# Patient Record
Sex: Female | Born: 1945 | Race: White | Hispanic: No | Smoking: Former smoker
Health system: Southern US, Community
[De-identification: ages and names within clinical notes are randomized; demographics above are authoritative.]

## PROBLEM LIST (undated history)

## (undated) DIAGNOSIS — R269 Unspecified abnormalities of gait and mobility: Principal | ICD-10-CM

## (undated) DIAGNOSIS — F482 Pseudobulbar affect: Secondary | ICD-10-CM

## (undated) DIAGNOSIS — E039 Hypothyroidism, unspecified: Secondary | ICD-10-CM

## (undated) DIAGNOSIS — M1990 Unspecified osteoarthritis, unspecified site: Secondary | ICD-10-CM

## (undated) DIAGNOSIS — G4730 Sleep apnea, unspecified: Secondary | ICD-10-CM

## (undated) DIAGNOSIS — G20 Parkinson's disease: Secondary | ICD-10-CM

## (undated) DIAGNOSIS — E669 Obesity, unspecified: Secondary | ICD-10-CM

## (undated) DIAGNOSIS — R5382 Chronic fatigue, unspecified: Secondary | ICD-10-CM

## (undated) DIAGNOSIS — M8580 Other specified disorders of bone density and structure, unspecified site: Secondary | ICD-10-CM

## (undated) DIAGNOSIS — F329 Major depressive disorder, single episode, unspecified: Secondary | ICD-10-CM

## (undated) DIAGNOSIS — F32A Depression, unspecified: Secondary | ICD-10-CM

## (undated) DIAGNOSIS — G231 Progressive supranuclear ophthalmoplegia [Steele-Richardson-Olszewski]: Secondary | ICD-10-CM

## (undated) DIAGNOSIS — G459 Transient cerebral ischemic attack, unspecified: Secondary | ICD-10-CM

## (undated) DIAGNOSIS — M25539 Pain in unspecified wrist: Secondary | ICD-10-CM

## (undated) DIAGNOSIS — H8109 Meniere's disease, unspecified ear: Secondary | ICD-10-CM

## (undated) DIAGNOSIS — E785 Hyperlipidemia, unspecified: Secondary | ICD-10-CM

## (undated) DIAGNOSIS — C50919 Malignant neoplasm of unspecified site of unspecified female breast: Secondary | ICD-10-CM

## (undated) DIAGNOSIS — F419 Anxiety disorder, unspecified: Secondary | ICD-10-CM

## (undated) HISTORY — DX: Unspecified abnormalities of gait and mobility: R26.9

## (undated) HISTORY — DX: Unspecified osteoarthritis, unspecified site: M19.90

## (undated) HISTORY — DX: Depression, unspecified: F32.A

## (undated) HISTORY — DX: Other specified disorders of bone density and structure, unspecified site: M85.80

## (undated) HISTORY — DX: Meniere's disease, unspecified ear: H81.09

## (undated) HISTORY — DX: Anxiety disorder, unspecified: F41.9

## (undated) HISTORY — PX: APPENDECTOMY: SHX54

## (undated) HISTORY — DX: Progressive supranuclear ophthalmoplegia (steele-Richardson-olszewski): G23.1

## (undated) HISTORY — DX: Obesity, unspecified: E66.9

## (undated) HISTORY — DX: Transient cerebral ischemic attack, unspecified: G45.9

## (undated) HISTORY — DX: Pain in unspecified wrist: M25.539

## (undated) HISTORY — DX: Sleep apnea, unspecified: G47.30

## (undated) HISTORY — DX: Parkinson's disease: G20

## (undated) HISTORY — DX: Chronic fatigue, unspecified: R53.82

## (undated) HISTORY — DX: Malignant neoplasm of unspecified site of unspecified female breast: C50.919

## (undated) HISTORY — DX: Pseudobulbar affect: F48.2

## (undated) HISTORY — DX: Hypothyroidism, unspecified: E03.9

## (undated) HISTORY — DX: Hyperlipidemia, unspecified: E78.5

## (undated) HISTORY — DX: Major depressive disorder, single episode, unspecified: F32.9

---

## 1982-11-27 HISTORY — PX: OOPHORECTOMY: SHX86

## 1982-11-27 HISTORY — PX: LAPAROSCOPIC VAGINAL HYSTERECTOMY: SUR798

## 1997-11-27 HISTORY — PX: NECK SURGERY: SHX720

## 2008-11-27 HISTORY — PX: MASTECTOMY: SHX3

## 2012-11-27 DIAGNOSIS — M25539 Pain in unspecified wrist: Secondary | ICD-10-CM

## 2012-11-27 HISTORY — DX: Pain in unspecified wrist: M25.539

## 2014-04-09 ENCOUNTER — Encounter: Payer: Self-pay | Admitting: Neurology

## 2014-04-13 ENCOUNTER — Ambulatory Visit (INDEPENDENT_AMBULATORY_CARE_PROVIDER_SITE_OTHER): Payer: Medicare Other | Admitting: Neurology

## 2014-04-13 ENCOUNTER — Encounter: Payer: Self-pay | Admitting: Neurology

## 2014-04-13 ENCOUNTER — Other Ambulatory Visit: Payer: Self-pay

## 2014-04-13 ENCOUNTER — Telehealth: Payer: Self-pay | Admitting: Neurology

## 2014-04-13 ENCOUNTER — Encounter (INDEPENDENT_AMBULATORY_CARE_PROVIDER_SITE_OTHER): Payer: Self-pay

## 2014-04-13 VITALS — BP 155/82 | HR 80 | Ht 70.0 in | Wt 274.0 lb

## 2014-04-13 DIAGNOSIS — R6889 Other general symptoms and signs: Secondary | ICD-10-CM

## 2014-04-13 DIAGNOSIS — D518 Other vitamin B12 deficiency anemias: Secondary | ICD-10-CM

## 2014-04-13 DIAGNOSIS — R269 Unspecified abnormalities of gait and mobility: Secondary | ICD-10-CM

## 2014-04-13 DIAGNOSIS — R413 Other amnesia: Secondary | ICD-10-CM

## 2014-04-13 HISTORY — DX: Unspecified abnormalities of gait and mobility: R26.9

## 2014-04-13 NOTE — Telephone Encounter (Signed)
MRI the brain was done on 10/27/2013. This shows mild atrophy and mild chronic small vessel ischemic changes. Nothing on the MRI explains her current symptomatology. This study was done at [facility].

## 2014-04-13 NOTE — Progress Notes (Signed)
Reason for visit: Gait disorder  Paige Murphy is a 68 y.o. female  History of present illness:  Paige Murphy is a 68 year old right-handed white female with a history of a gait disorder that began approximately 2 years ago. The patient has had increasing episodes of falling, particularly over the last several months. She indicates that she may fall backwards or forwards or to the side. The husband indicates that her recent episode seemed to be associated with an event of syncope. The patient denies any numbness or weakness of the extremities. She has had an alteration in speech with decreased amplitude, and her husband is having increasing problems understanding what she is saying. Her handwriting has changed, and has become more sloppy, and smaller in amplitude. The patient indicates that she has no problems controlling the bowels or the bladder. She does report a history of Mnire's disease, but her last event of vertigo was 3 years ago. She has been undergoing physical therapy for gait training actively. She has undergone CT scan evaluation of the brain on 2 occasions, and she has had a recent MRI of the brain. These studies were done at [facility], and the report is not available to me. She does have some fatigue during the day, and she has had some problems with swallowing. She also reports some progression of memory problems over the last 6 months. She is sent to this office for further evaluation. Her mother has a history of Parkinson's disease.  Past Medical History  Diagnosis Date  . Wrist pain 2014  . Abnormality of gait 04/13/2014  . Hypothyroid   . Dyslipidemia   . Obese   . Depression   . Osteopenia   . Breast cancer     s/p radiation  . Meniere's disease     Past Surgical History  Procedure Laterality Date  . Laparoscopic vaginal hysterectomy  1984  . Mastectomy  2010    breast cancer, left  . Neck surgery  1999    cervical procedure  . Oophorectomy  1984  .  Appendectomy      Family History  Problem Relation Age of Onset  . Dementia Mother   . COPD Mother   . Parkinsonism Mother   . Stroke Mother   . Heart disease Father   . Heart disease Brother   . Heart disease Sister     Social history:  reports that she has never smoked. She does not have any smokeless tobacco history on file. She reports that she drinks alcohol. She reports that she does not use illicit drugs.  Medications:  Current Outpatient Prescriptions on File Prior to Visit  Medication Sig Dispense Refill  . alendronate (FOSAMAX) 70 MG tablet Take 70 mg by mouth once a week. Take with a full glass of water on an empty stomach.      Marland Kitchen atorvastatin (LIPITOR) 40 MG tablet Take 40 mg by mouth daily.      Marland Kitchen escitalopram (LEXAPRO) 10 MG tablet Take 10 mg by mouth daily.      Marland Kitchen levothyroxine (SYNTHROID, LEVOTHROID) 125 MCG tablet Take 125 mcg by mouth daily before breakfast.      . Multiple Vitamin (MULTIVITAMIN) tablet Take 1 tablet by mouth daily.       No current facility-administered medications on file prior to visit.     No Known Allergies  ROS:  Out of a complete 14 system review of symptoms, the patient complains only of the following symptoms, and all  other reviewed systems are negative.  Weight gain, fatigue Hearing loss, ringing in the ears, dizziness, difficulty swallowing Moles Blurred vision Shortness of breath, snoring Urination problems Joint pain, joint swelling Memory loss, difficulty swallowing Depression, not enough sleep, decreased energy, and disinterest in activities Sleepiness, snoring  Blood pressure 155/82, pulse 80, height 5\' 10"  (1.778 m), weight 274 lb (124.286 kg).  Physical Exam  General: The patient is alert and cooperative at the time of the examination. Masking of the face is seen.  Eyes: Pupils are equal, round, and reactive to light. Discs are flat bilaterally.  Neck: The neck is supple, no carotid bruits are  noted.  Respiratory: The respiratory examination is clear.  Cardiovascular: The cardiovascular examination reveals a regular rate and rhythm, no obvious murmurs or rubs are noted.  Skin: Extremities are with 1+ edema of ankles bilaterally.  Neurologic Exam  Mental status: The patient is alert and oriented x 3 at the time of the examination. The patient has apparent normal recent and remote memory, with an apparently normal attention span and concentration ability.  Cranial nerves: Facial symmetry is present. There is good sensation of the face to pinprick and soft touch bilaterally. The strength of the facial muscles and the muscles to head turning and shoulder shrug are normal bilaterally. Speech is well enunciated, no aphasia or dysarthria is noted. Extraocular movements are full, with exception that there is slight restriction of superior gaze. Visual fields are full. The tongue is midline, and the patient has symmetric elevation of the soft palate. No obvious hearing deficits are noted.  Motor: The motor testing reveals 5 over 5 strength of all 4 extremities. Good symmetric motor tone is noted throughout.  Sensory: Sensory testing is intact to pinprick, soft touch, vibration sensation, and position sense on all 4 extremities. No evidence of extinction is noted.  Coordination: Cerebellar testing reveals good finger-nose-finger and heel-to-shin bilaterally.  Gait and station: Gait is associated with excellent arm swing, the patient has some difficulty arising from a seated position with arms crossed Tandem gait is slightly unsteady. Romberg is negative. No drift is seen.  Reflexes: Deep tendon reflexes are symmetric and normal bilaterally. Toes are downgoing bilaterally.   Assessment/Plan:  One. Gait disorder  2. Memory disorder  The patient apparently has already had MRI and CT evaluation of the brain. We will try to obtain the report, and the patient has the disc of the studies,  and she will mail this to our office. The patient will undergo blood work today. The patient has features of parkinsonism, but with walking, she has excellent arm swing. The patient needs to be evaluated for possible small vessel disease or normal pressure hydrocephalus as an etiology for her gait and memory problems. Progressive supranuclear palsy may begin with gait issues, and parkinsonism. She will followup in 4 months. The patient is to continue the physical therapy for gait training.   Jill Alexanders MD 04/13/2014 12:21 PM  [facility] [address] [place], [place] [address]  Phone [phone] Fax [phone]

## 2014-04-13 NOTE — Patient Instructions (Signed)

## 2014-04-15 LAB — TSH: TSH: 0.727 u[IU]/mL (ref 0.450–4.500)

## 2014-04-15 LAB — RPR: RPR: NONREACTIVE

## 2014-04-15 LAB — COPPER, SERUM: Copper: 104 ug/dL (ref 72–166)

## 2014-04-15 LAB — VITAMIN B12: VITAMIN B 12: 555 pg/mL (ref 211–946)

## 2014-05-05 ENCOUNTER — Telehealth: Payer: Self-pay | Admitting: *Deleted

## 2014-05-05 NOTE — Telephone Encounter (Signed)
I called the patient, left a message, I will call back later. 

## 2014-05-05 NOTE — Telephone Encounter (Signed)
Pt had normal labs, MRI atrophy and SVD, not explaining her sx.

## 2014-05-06 NOTE — Telephone Encounter (Signed)
I called patient. I discussed the results of the MRI and the blood work. The MRI showed minimal small vessel disease, blood work was unremarkable. The patient has some features of parkinsonism, but not a full exam consistent with Parkinson's disease. The patient will need to be followed for her walking problem over time. She has gone through some physical therapy with good benefit, no further falls. We will follow her over time to look for evolutionary changes that would suggest Parkinson's disease or progressive supranuclear palsy. We may consider medications for the patient in the future.

## 2014-08-14 ENCOUNTER — Encounter: Payer: Self-pay | Admitting: Neurology

## 2014-08-14 ENCOUNTER — Encounter (INDEPENDENT_AMBULATORY_CARE_PROVIDER_SITE_OTHER): Payer: Self-pay

## 2014-08-14 ENCOUNTER — Ambulatory Visit (INDEPENDENT_AMBULATORY_CARE_PROVIDER_SITE_OTHER): Payer: Medicare Other | Admitting: Neurology

## 2014-08-14 VITALS — BP 135/82 | HR 76 | Wt 266.0 lb

## 2014-08-14 DIAGNOSIS — R269 Unspecified abnormalities of gait and mobility: Secondary | ICD-10-CM

## 2014-08-14 MED ORDER — CARBIDOPA-LEVODOPA 25-100 MG PO TABS: ORAL_TABLET | ORAL | Status: DC

## 2014-08-14 NOTE — Patient Instructions (Signed)

## 2014-08-14 NOTE — Progress Notes (Signed)
Reason for visit: Gait disorder  Paige Murphy is an 68 y.o. female  History of present illness:  Paige Murphy is a 68 year old right-handed white female with a history of a progressive gait disorder. The patient continues to have difficulty with balance, and she has fallen on 10 occasions since last seen. The patient does not use a cane for ambulation. She indicates that she has gone through physical therapy, but this has not benefited her. The patient is having problems choking on her saliva, and she will drool on occasion. She continues to have difficulty getting up from a chair, and she feels as if the activities of daily living have slowed down. She has undergone MRI evaluation of the brain that shows minimal small vessel disease, blood work has been unremarkable. The patient returns for an evaluation. The patient has had some alteration in speech. She reports some neck discomfort, and some discomfort down the right arm that has occurred over the last several months.  Past Medical History  Diagnosis Date  . Wrist pain 2014  . Abnormality of gait 04/13/2014  . Hypothyroid   . Dyslipidemia   . Obese   . Depression   . Osteopenia   . Breast cancer     s/p radiation  . Meniere's disease     Past Surgical History  Procedure Laterality Date  . Laparoscopic vaginal hysterectomy  1984  . Mastectomy  2010    breast cancer, left  . Neck surgery  1999    cervical procedure  . Oophorectomy  1984  . Appendectomy      Family History  Problem Relation Age of Onset  . Dementia Mother   . COPD Mother   . Parkinsonism Mother   . Stroke Mother   . Heart disease Father   . Heart disease Brother   . Heart disease Sister     Social history:  reports that she has never smoked. She has never used smokeless tobacco. She reports that she drinks alcohol. She reports that she does not use illicit drugs.   No Known Allergies  Medications:  Current Outpatient Prescriptions on File Prior  to Visit  Medication Sig Dispense Refill  . alendronate (FOSAMAX) 70 MG tablet Take 70 mg by mouth once a week. Take with a full glass of water on an empty stomach.      Marland Kitchen aspirin 325 MG tablet Take 325 mg by mouth daily.      Marland Kitchen atorvastatin (LIPITOR) 40 MG tablet Take 40 mg by mouth daily.      Marland Kitchen levothyroxine (SYNTHROID, LEVOTHROID) 125 MCG tablet Take 125 mcg by mouth daily before breakfast.      . Multiple Vitamin (MULTIVITAMIN) tablet Take 1 tablet by mouth daily.       No current facility-administered medications on file prior to visit.    ROS:  Out of a complete 14 system review of symptoms, the patient complains only of the following symptoms, and all other reviewed systems are negative.  Fatigue Hearing loss, ringing in the ears, difficulty swallowing, drooling Cough, shortness of breath, choking Daytime sleepiness, snoring Joint pain, joint swelling, back pain, walking difficulties Daytime sleepiness, snoring Bruising easily Memory loss, dizziness, speech difficulty, weakness, facial drooping  Blood pressure 135/82, pulse 76, weight 266 lb (120.657 kg).  Blood pressure, standing, right arm is 164/88. Blood pressure, sitting, right arm is 162/80.  Physical Exam  General: The patient is alert and cooperative at the time of the examination. The patient  is moderately obese.  Skin: No significant peripheral edema is noted.   Neurologic Exam  Mental status: The patient is oriented x 3. Mini-Mental status examination done today shows a total score of 27/30.  Cranial nerves: Facial symmetry is present. Speech is mildly dysarthric. Extraocular movements are full. Visual fields are full. Masking of the face is seen.  Motor: The patient has good strength in all 4 extremities.  Sensory examination: Soft touch sensation is symmetric on the face, arms, and legs.  Coordination: The patient has good finger-nose-finger and heel-to-shin bilaterally.  Gait and station: The  patient has a relatively normal gait, good arm swing. The patient has some slight difficulty arising from a seated position with arms crossed. Tandem gait is slightly unsteady. Romberg is negative. No drift is seen.  Reflexes: Deep tendon reflexes are symmetric in the legs, but there is an increase in the left biceps and triceps reflex on the left relative to the right.   Assessment/Plan:  1. Gait disorder  The patient appears to have features of parkinsonism, but her ability to ambulate is not typical for Parkinson's disease or parkinsonism, as she has good arm swing, and relatively good posture. The patient will begin a trial on low-dose Sinemet taking the 25/100 mg tablets, one half times daily for 3 weeks, then go to 1 full tablet 3 times daily. The patient will followup in 3 months. The patient may have PSP or MSA rather than Parkinson's disease. The patient likely does have a neurodegenerative process of some sort resulting in her gait disorder. A second opinion in the future may be indicated.  Jill Alexanders MD 08/15/2014 9:35 AM  [facility] [address] [place], [place] [address]  Phone [phone] Fax [phone]

## 2014-10-14 ENCOUNTER — Encounter: Payer: Self-pay | Admitting: Neurology

## 2014-10-20 ENCOUNTER — Encounter: Payer: Self-pay | Admitting: Neurology

## 2014-10-21 ENCOUNTER — Encounter (HOSPITAL_COMMUNITY): Payer: Self-pay | Admitting: Emergency Medicine

## 2014-10-21 ENCOUNTER — Emergency Department (HOSPITAL_COMMUNITY): Payer: Medicare Other

## 2014-10-21 ENCOUNTER — Emergency Department (HOSPITAL_COMMUNITY)
Admission: EM | Admit: 2014-10-21 | Discharge: 2014-10-21 | Disposition: A | Discharge status: Home / Self Care | Payer: Medicare Other | Attending: Emergency Medicine | Admitting: Emergency Medicine

## 2014-10-21 DIAGNOSIS — Z853 Personal history of malignant neoplasm of breast: Secondary | ICD-10-CM | POA: Insufficient documentation

## 2014-10-21 DIAGNOSIS — E669 Obesity, unspecified: Secondary | ICD-10-CM | POA: Insufficient documentation

## 2014-10-21 DIAGNOSIS — Z79899 Other long term (current) drug therapy: Secondary | ICD-10-CM | POA: Insufficient documentation

## 2014-10-21 DIAGNOSIS — S60211A Contusion of right wrist, initial encounter: Secondary | ICD-10-CM

## 2014-10-21 DIAGNOSIS — S99911A Unspecified injury of right ankle, initial encounter: Secondary | ICD-10-CM | POA: Diagnosis present

## 2014-10-21 DIAGNOSIS — Y9389 Activity, other specified: Secondary | ICD-10-CM | POA: Insufficient documentation

## 2014-10-21 DIAGNOSIS — E785 Hyperlipidemia, unspecified: Secondary | ICD-10-CM | POA: Diagnosis not present

## 2014-10-21 DIAGNOSIS — Y998 Other external cause status: Secondary | ICD-10-CM | POA: Insufficient documentation

## 2014-10-21 DIAGNOSIS — W010XXA Fall on same level from slipping, tripping and stumbling without subsequent striking against object, initial encounter: Secondary | ICD-10-CM | POA: Diagnosis not present

## 2014-10-21 DIAGNOSIS — S93401A Sprain of unspecified ligament of right ankle, initial encounter: Secondary | ICD-10-CM | POA: Diagnosis not present

## 2014-10-21 DIAGNOSIS — Z8739 Personal history of other diseases of the musculoskeletal system and connective tissue: Secondary | ICD-10-CM | POA: Insufficient documentation

## 2014-10-21 DIAGNOSIS — F329 Major depressive disorder, single episode, unspecified: Secondary | ICD-10-CM | POA: Diagnosis not present

## 2014-10-21 DIAGNOSIS — R51 Headache: Secondary | ICD-10-CM | POA: Diagnosis not present

## 2014-10-21 DIAGNOSIS — R52 Pain, unspecified: Secondary | ICD-10-CM

## 2014-10-21 DIAGNOSIS — Y92009 Unspecified place in unspecified non-institutional (private) residence as the place of occurrence of the external cause: Secondary | ICD-10-CM | POA: Insufficient documentation

## 2014-10-21 DIAGNOSIS — W19XXXA Unspecified fall, initial encounter: Secondary | ICD-10-CM

## 2014-10-21 LAB — COMPREHENSIVE METABOLIC PANEL
ALK PHOS: 45 U/L (ref 39–117)
ALT: 21 U/L (ref 0–35)
AST: 20 U/L (ref 0–37)
Albumin: 3.9 g/dL (ref 3.5–5.2)
Anion gap: 11 (ref 5–15)
BUN: 16 mg/dL (ref 6–23)
CALCIUM: 9 mg/dL (ref 8.4–10.5)
CO2: 25 mEq/L (ref 19–32)
Chloride: 105 mEq/L (ref 96–112)
Creatinine, Ser: 0.77 mg/dL (ref 0.50–1.10)
GFR calc Af Amer: >90 mL/min (ref >90)
GFR calc non Af Amer: 86 mL/min — ABNORMAL LOW (ref >90)
Glucose, Bld: 127 mg/dL — ABNORMAL HIGH (ref 70–99)
POTASSIUM: 4.1 meq/L (ref 3.7–5.3)
SODIUM: 141 meq/L (ref 137–147)
TOTAL PROTEIN: 7.1 g/dL (ref 6.0–8.3)
Total Bilirubin: 0.3 mg/dL (ref 0.3–1.2)

## 2014-10-21 LAB — CBC WITH DIFFERENTIAL/PLATELET
BASOS PCT: 1 % (ref 0–1)
Basophils Absolute: 0.1 10*3/uL (ref 0.0–0.1)
EOS ABS: 0.2 10*3/uL (ref 0.0–0.7)
Eosinophils Relative: 2 % (ref 0–5)
HCT: 40.6 % (ref 36.0–46.0)
Hemoglobin: 13.7 g/dL (ref 12.0–15.0)
Lymphocytes Relative: 34 % (ref 12–46)
Lymphs Abs: 3.1 10*3/uL (ref 0.7–4.0)
MCH: 32 pg (ref 26.0–34.0)
MCHC: 33.7 g/dL (ref 30.0–36.0)
MCV: 94.9 fL (ref 78.0–100.0)
Monocytes Absolute: 0.7 10*3/uL (ref 0.1–1.0)
Monocytes Relative: 8 % (ref 3–12)
Neutro Abs: 5.1 10*3/uL (ref 1.7–7.7)
Neutrophils Relative %: 55 % (ref 43–77)
PLATELETS: 257 10*3/uL (ref 150–400)
RBC: 4.28 MIL/uL (ref 3.87–5.11)
RDW: 12.8 % (ref 11.5–15.5)
WBC: 9.2 10*3/uL (ref 4.0–10.5)

## 2014-10-21 MED ORDER — ACETAMINOPHEN 325 MG PO TABS
650.0000 mg | ORAL_TABLET | Freq: Once | ORAL | Status: AC
  Administered 2014-10-21: 650 mg via ORAL
  Filled 2014-10-21: qty 2

## 2014-10-21 NOTE — Discharge Instructions (Signed)
Fall Prevention and Home Safety Falls cause injuries and can affect all age groups. It is possible to use preventive measures to significantly decrease the likelihood of falls. There are many simple measures which can make your home safer and prevent falls. OUTDOORS  Repair cracks and edges of walkways and driveways.  Remove high doorway thresholds.  Trim shrubbery on the main path into your home.  Have good outside lighting.  Clear walkways of tools, rocks, debris, and clutter.  Check that handrails are not broken and are securely fastened. Both sides of steps should have handrails.  Have leaves, snow, and ice cleared regularly.  Use sand or salt on walkways during winter months.  In the garage, clean up grease or oil spills. BATHROOM  Install night lights.  Install grab bars by the toilet and in the tub and shower.  Use non-skid mats or decals in the tub or shower.  Place a plastic non-slip stool in the shower to sit on, if needed.  Keep floors dry and clean up all water on the floor immediately.  Remove soap buildup in the tub or shower on a regular basis.  Secure bath mats with non-slip, double-sided rug tape.  Remove throw rugs and tripping hazards from the floors. BEDROOMS  Install night lights.  Make sure a bedside light is easy to reach.  Do not use oversized bedding.  Keep a telephone by your bedside.  Have a firm chair with side arms to use for getting dressed.  Remove throw rugs and tripping hazards from the floor. KITCHEN  Keep handles on pots and pans turned toward the center of the stove. Use back burners when possible.  Clean up spills quickly and allow time for drying.  Avoid walking on wet floors.  Avoid hot utensils and knives.  Position shelves so they are not too high or low.  Place commonly used objects within easy reach.  If necessary, use a sturdy step stool with a grab bar when reaching.  Keep electrical cables out of the  way.  Do not use floor polish or wax that makes floors slippery. If you must use wax, use non-skid floor wax.  Remove throw rugs and tripping hazards from the floor. STAIRWAYS  Never leave objects on stairs.  Place handrails on both sides of stairways and use them. Fix any loose handrails. Make sure handrails on both sides of the stairways are as long as the stairs.  Check carpeting to make sure it is firmly attached along stairs. Make repairs to worn or loose carpet promptly.  Avoid placing throw rugs at the top or bottom of stairways, or properly secure the rug with carpet tape to prevent slippage. Get rid of throw rugs, if possible.  Have an electrician put in a light switch at the top and bottom of the stairs. OTHER FALL PREVENTION TIPS  Wear low-heel or rubber-soled shoes that are supportive and fit well. Wear closed toe shoes.  When using a stepladder, make sure it is fully opened and both spreaders are firmly locked. Do not climb a closed stepladder.  Add color or contrast paint or tape to grab bars and handrails in your home. Place contrasting color strips on first and last steps.  Learn and use mobility aids as needed. Install an electrical emergency response system.  Turn on lights to avoid dark areas. Replace light bulbs that burn out immediately. Get light switches that glow.  Arrange furniture to create clear pathways. Keep furniture in the same place.  Firmly attach carpet with non-skid or double-sided tape. °· Eliminate uneven floor surfaces. °· Select a carpet pattern that does not visually hide the edge of steps. °· Be aware of all pets. °OTHER HOME SAFETY TIPS °· Set the water temperature for 120° F (48.8° C). °· Keep emergency numbers on or near the telephone. °· Keep smoke detectors on every level of the home and near sleeping areas. °Document Released: 11/03/2002 Document Revised: 05/14/2012 Document Reviewed: 02/02/2012 °[employer]® Patient Information ©2015  [employer]. This information is not intended to replace advice given to you by your health care provider. Make sure you discuss any questions you have with your health care provider. °Contusion °A contusion is a deep bruise. Contusions are the result of an injury that caused bleeding under the skin. The contusion may turn blue, purple, or yellow. Minor injuries will give you a painless contusion, but more severe contusions may stay painful and swollen for a few weeks.  °CAUSES  °A contusion is usually caused by a blow, trauma, or direct force to an area of the body. °SYMPTOMS  °· Swelling and redness of the injured area. °· Bruising of the injured area. °· Tenderness and soreness of the injured area. °· Pain. °DIAGNOSIS  °The diagnosis can be made by taking a history and physical exam. An X-ray, CT scan, or MRI may be needed to determine if there were any associated injuries, such as fractures. °TREATMENT  °Specific treatment will depend on what area of the body was injured. In general, the best treatment for a contusion is resting, icing, elevating, and applying cold compresses to the injured area. Over-the-counter medicines may also be recommended for pain control. Ask your caregiver what the best treatment is for your contusion. °HOME CARE INSTRUCTIONS  °· Put ice on the injured area. °· Put ice in a plastic bag. °· Place a towel between your skin and the bag. °· Leave the ice on for 15-20 minutes, 3-4 times a day, or as directed by your health care provider. °· Only take over-the-counter or prescription medicines for pain, discomfort, or fever as directed by your caregiver. Your caregiver may recommend avoiding anti-inflammatory medicines (aspirin, ibuprofen, and naproxen) for 48 hours because these medicines may increase bruising. °· Rest the injured area. °· If possible, elevate the injured area to reduce swelling. °SEEK IMMEDIATE MEDICAL CARE IF:  °· You have increased bruising or swelling. °· You have  pain that is getting worse. °· Your swelling or pain is not relieved with medicines. °MAKE SURE YOU:  °· Understand these instructions. °· Will watch your condition. °· Will get help right away if you are not doing well or get worse. °Document Released: 08/23/2005 Document Revised: 11/18/2013 Document Reviewed: 09/18/2011 °Exit[employer]® Patient Information ©2015 [employer]. This information is not intended to replace advice given to you by your health care provider. Make sure you discuss any questions you have with your health care provider. ° °

## 2014-10-21 NOTE — ED Notes (Signed)
Pt. reports multiple falls for the last several weeks and again this evening at home , pt. lost her balance at fell on her right side at laundry room at home , no LOC ,alert and oriented , states pain at right ankle , right knee and right wrist.

## 2014-10-21 NOTE — ED Notes (Signed)
Family at bedside. 

## 2014-10-21 NOTE — ED Provider Notes (Signed)
CSN: [id number]     Arrival date & time 10/21/14  0035 History   This chart was scribed for Artis Delay, MD by Evelene Croon, ED Scribe. This patient was seen in room D35C/D35C and the patient's care was started 3:16 AM.  Chief Complaint  Patient presents with  . Fall    The history is provided by the patient and a relative. No language interpreter was used.   HPI Comments:  Paige Murphy is a 68 y.o. female who presents to the Emergency Department s/p fall last night complaining of 9/10 pain to her right ankle and knee following the incident.  She believes she lost her balance while in the laundry room but is unsure. She also notes she struck the back of her head but does not believe she lost consciousness. She rates her HA a 6-7/10. Per pt's family pt has experienced frequent falls within the last six months. She has followed up with PCP and referred to a neurologist. At this time she denies back pain. No alleviating factors noted.  Past Medical History  Diagnosis Date  . Wrist pain 2014  . Abnormality of gait 04/13/2014  . Hypothyroid   . Dyslipidemia   . Obese   . Depression   . Osteopenia   . Breast cancer     s/p radiation  . Meniere's disease    Past Surgical History  Procedure Laterality Date  . Laparoscopic vaginal hysterectomy  1984  . Mastectomy  2010    breast cancer, left  . Neck surgery  1999    cervical procedure  . Oophorectomy  1984  . Appendectomy     Family History  Problem Relation Age of Onset  . Dementia Mother   . COPD Mother   . Parkinsonism Mother   . Stroke Mother   . Heart disease Father   . Heart disease Brother   . Heart disease Sister    History  Substance Use Topics  . Smoking status: Never Smoker   . Smokeless tobacco: Never Used  . Alcohol Use: Yes   OB History    No data available     Review of Systems  Musculoskeletal: Positive for myalgias and arthralgias.  Neurological: Positive for headaches.  All other systems reviewed  and are negative.     Allergies  Review of patient's allergies indicates no known allergies.  Home Medications   Prior to Admission medications   Medication Sig Start Date End Date Taking? Authorizing Provider  alendronate (FOSAMAX) 70 MG tablet Take 70 mg by mouth once a week. Take with a full glass of water on an empty stomach.    Historical Provider, MD  aspirin 325 MG tablet Take 325 mg by mouth daily.    Historical Provider, MD  atorvastatin (LIPITOR) 40 MG tablet Take 40 mg by mouth daily.    Historical Provider, MD  carbidopa-levodopa (SINEMET) 25-100 MG per tablet One half tablet three times a day for 3 weeks, then take one tablet three times a day 08/14/14   Kathrynn Ducking, MD  citalopram (CELEXA) 20 MG tablet Take 20 mg by mouth daily.    Historical Provider, MD  levothyroxine (SYNTHROID, LEVOTHROID) 125 MCG tablet Take 125 mcg by mouth daily before breakfast.    Historical Provider, MD  Multiple Vitamin (MULTIVITAMIN) tablet Take 1 tablet by mouth daily.    Historical Provider, MD   BP 144/68 mmHg  Pulse 80  Temp(Src) 98.4 F (36.9 C) (Oral)  Resp 17  Ht  5\' 9"  (1.753 m)  Wt 260 lb (117.935 kg)  BMI 38.38 kg/m2  SpO2 98% Physical Exam  Constitutional: She is oriented to person, place, and time. She appears well-developed and well-nourished. No distress.  HENT:  Head: Normocephalic and atraumatic. Head is without raccoon's eyes and without Battle's sign.    Nose: Nose normal.  Eyes: Conjunctivae and EOM are normal. Pupils are equal, round, and reactive to light. No scleral icterus.  Neck: No spinous process tenderness and no muscular tenderness present.  Cardiovascular: Normal rate, regular rhythm, normal heart sounds and intact distal pulses.   No murmur heard. Pulmonary/Chest: Effort normal and breath sounds normal. She has no rales. She exhibits no tenderness.  Abdominal: Soft. There is no tenderness. There is no rebound and no guarding.  Musculoskeletal: Normal  range of motion. She exhibits no edema.       Right wrist: She exhibits tenderness (Mild ecchymosis). She exhibits normal range of motion.       Right knee: She exhibits normal range of motion, no swelling, no effusion and no deformity. Tenderness found.       Right ankle: She exhibits swelling (mild). She exhibits normal range of motion, no ecchymosis, no deformity, no laceration and normal pulse. Tenderness.       Thoracic back: She exhibits no tenderness and no bony tenderness.       Lumbar back: She exhibits no tenderness and no bony tenderness.  No evidence of trauma to extremities, except as noted.  2+ distal pulses.    Neurological: She is alert and oriented to person, place, and time.  Skin: Skin is warm and dry. No rash noted.  Psychiatric: She has a normal mood and affect.  Nursing note and vitals reviewed.   ED Course  Procedures   DIAGNOSTIC STUDIES:  Oxygen Saturation is 98% on RA, normal by my interpretation.    COORDINATION OF CARE:  3:22 AM Will order XRs. Discussed treatment plan with pt at bedside and pt agreed to plan.  Labs Review Labs Reviewed  COMPREHENSIVE METABOLIC PANEL - Abnormal; Notable for the following:    Glucose, Bld 127 (*)    GFR calc non Af Amer 86 (*)    All other components within normal limits  CBC WITH DIFFERENTIAL    Imaging Review Dg Wrist Complete Right  10/21/2014   CLINICAL DATA:  Fall.  Wrist pain  EXAM: RIGHT WRIST - COMPLETE 3+ VIEW  COMPARISON:  None.  FINDINGS: There is no evidence of fracture or dislocation. There is no evidence of arthropathy or other focal bone abnormality. Soft tissues are unremarkable.  IMPRESSION: Negative.   Electronically Signed   By: Kerby Moors M.D.   On: 10/21/2014 02:04   Dg Ankle Complete Right  10/21/2014   CLINICAL DATA:  Fall with right-sided ankle pain.  Initial encounter  EXAM: RIGHT ANKLE - COMPLETE 3+ VIEW  COMPARISON:  None.  FINDINGS: There is no evidence of fracture, dislocation, or  joint effusion. There is a 2 mm long metallic foreign body associated with the dermis of the plantar heel.  IMPRESSION: 1. No acute osseous findings. 2. 2 mm foreign body of the plantar heel dermis.   Electronically Signed   By: Jorje Guild M.D.   On: 10/21/2014 02:01   Ct Head Wo Contrast  10/21/2014   CLINICAL DATA:  Multiple falls over last few weeks. Fall today at home in laundry room. No loss of consciousness.  EXAM: CT HEAD WITHOUT CONTRAST  CT  CERVICAL SPINE WITHOUT CONTRAST  TECHNIQUE: Multidetector CT imaging of the head and cervical spine was performed following the standard protocol without intravenous contrast. Multiplanar CT image reconstructions of the cervical spine were also generated.  COMPARISON:  CT of the head October 02, 2014  FINDINGS: CT HEAD FINDINGS  The ventricles and sulci are normal for age. No intraparenchymal hemorrhage, mass effect nor midline shift. Patchy supratentorial white matter hypodensities are within normal range for patient's age and though non-specific suggest sequelae of chronic small vessel ischemic disease. No acute large vascular territory infarcts.  No abnormal extra-axial fluid collections. Basal cisterns are patent. Moderate calcific atherosclerosis of the carotid siphons.  No skull fracture. The included ocular globes and orbital contents are non-suspicious. The mastoid aircells and included paranasal sinuses are well-aerated.  CT CERVICAL SPINE FINDINGS  Cervical vertebral bodies and posterior elements appear intact on line of broad reversed cervical lordosis. Moderate C3-4, C4-5 and moderate to severe C6-7 degenerative discs. C1-2 articulation maintained with moderate to severe arthropathy. No destructive bony lesions. Nuchal ligament calcifications. Prevertebral soft tissues are nonsuspicious.  Uncovertebral hypertrophy and facet arthropathy without osseous canal stenosis. Severe LEFT C4-5, moderate to severe RIGHT C5-6 neural foraminal narrowing. Severe  lower cervical facet arthropathy on the RIGHT.  IMPRESSION: CT HEAD: No acute intracranial process ; normal noncontrast CT of the head for age, stable from prior imaging.  CT CERVICAL SPINE: Broad reversed cervical lordosis without acute fracture or malalignment.   Electronically Signed   By: Elon Alas   On: 10/21/2014 04:10   Ct Cervical Spine Wo Contrast  10/21/2014   CLINICAL DATA:  Multiple falls over last few weeks. Fall today at home in laundry room. No loss of consciousness.  EXAM: CT HEAD WITHOUT CONTRAST  CT CERVICAL SPINE WITHOUT CONTRAST  TECHNIQUE: Multidetector CT imaging of the head and cervical spine was performed following the standard protocol without intravenous contrast. Multiplanar CT image reconstructions of the cervical spine were also generated.  COMPARISON:  CT of the head October 02, 2014  FINDINGS: CT HEAD FINDINGS  The ventricles and sulci are normal for age. No intraparenchymal hemorrhage, mass effect nor midline shift. Patchy supratentorial white matter hypodensities are within normal range for patient's age and though non-specific suggest sequelae of chronic small vessel ischemic disease. No acute large vascular territory infarcts.  No abnormal extra-axial fluid collections. Basal cisterns are patent. Moderate calcific atherosclerosis of the carotid siphons.  No skull fracture. The included ocular globes and orbital contents are non-suspicious. The mastoid aircells and included paranasal sinuses are well-aerated.  CT CERVICAL SPINE FINDINGS  Cervical vertebral bodies and posterior elements appear intact on line of broad reversed cervical lordosis. Moderate C3-4, C4-5 and moderate to severe C6-7 degenerative discs. C1-2 articulation maintained with moderate to severe arthropathy. No destructive bony lesions. Nuchal ligament calcifications. Prevertebral soft tissues are nonsuspicious.  Uncovertebral hypertrophy and facet arthropathy without osseous canal stenosis. Severe LEFT  C4-5, moderate to severe RIGHT C5-6 neural foraminal narrowing. Severe lower cervical facet arthropathy on the RIGHT.  IMPRESSION: CT HEAD: No acute intracranial process ; normal noncontrast CT of the head for age, stable from prior imaging.  CT CERVICAL SPINE: Broad reversed cervical lordosis without acute fracture or malalignment.   Electronically Signed   By: Elon Alas   On: 10/21/2014 04:10   Dg Knee Complete 4 Views Right  10/21/2014   CLINICAL DATA:  Fall with right-sided knee pain.  Initial encounter  EXAM: RIGHT KNEE - COMPLETE 4+ VIEW  COMPARISON:  None.  FINDINGS: There is no evidence of fracture, dislocation, or joint effusion.  There is tricompartmental osteoarthritis with small marginal spurs and mild diffuse joint narrowing.  IMPRESSION: 1. No acute osseous findings. 2. Knee osteoarthritis.   Electronically Signed   By: Jorje Guild M.D.   On: 10/21/2014 02:03  All radiology studies independently viewed by me.      EKG Interpretation None      MDM   Final diagnoses:  Fall, initial encounter  Right ankle sprain, initial encounter  Wrist contusion, right, initial encounter    68 year old female with a six-month history of frequent falls, worked up by an outpatient neurologist and felt to be a movement disorder of one form or another, who presents tonight after fall.  Complains primarily of right ankle and right wrist pain. She also struck her head, but denies loss of consciousness. No neck pain.  Imaging negative. Placed in right ankle ace wrap.  Ambulated at baseline.  DC home with PCP and neurology followup.    I personally performed the services described in this documentation, which was scribed in my presence. The recorded information has been reviewed and is accurate.       Artis Delay, MD 10/21/14 [phone]

## 2014-12-10 ENCOUNTER — Ambulatory Visit: Payer: Medicare Other | Admitting: Neurology

## 2014-12-24 ENCOUNTER — Encounter: Payer: Self-pay | Admitting: Neurology

## 2014-12-24 ENCOUNTER — Ambulatory Visit (INDEPENDENT_AMBULATORY_CARE_PROVIDER_SITE_OTHER): Payer: Medicare Other | Admitting: Neurology

## 2014-12-24 VITALS — BP 126/77 | HR 91 | Ht 69.0 in | Wt 261.2 lb

## 2014-12-24 DIAGNOSIS — G20C Parkinsonism, unspecified: Secondary | ICD-10-CM

## 2014-12-24 DIAGNOSIS — G20 Parkinson's disease: Secondary | ICD-10-CM

## 2014-12-24 DIAGNOSIS — R269 Unspecified abnormalities of gait and mobility: Secondary | ICD-10-CM

## 2014-12-24 HISTORY — DX: Parkinson's disease: G20

## 2014-12-24 HISTORY — DX: Parkinsonism, unspecified: G20.C

## 2014-12-24 MED ORDER — CARBIDOPA-LEVODOPA ER 23.75-95 MG PO CPCR: 1.0000 | ORAL_CAPSULE | Freq: Three times a day (TID) | ORAL | Status: DC

## 2014-12-24 NOTE — Progress Notes (Signed)
Reason for visit: Gait disorder  Paige Murphy is an 69 y.o. female  History of present illness:  Paige Murphy is a 69 year old right-handed white female with a history of parkinsonism and a gait disorder. She was given a trial on Sinemet when last seen, she was able to get to the 25/100 mg tablets 3 times daily, but she indicated this caused too much drowsiness during the day, and she stopped the medication. While on the drug, she did not note any particular change in her ability to ambulate. Over time, her walking has worsened, and she has had multiple falls, falling several times a week. The patient has had MRI evaluation of the brain done in December 2014 that was unremarkable. This showed minimal white matter changes. The patient has no evidence of normal pressure hydrocephalus. Her speech has become more slurred, she is having slowing of cognitive processing, and she continues to fall frequently. She is using a quad cane for ambulation. She may fall with or without the cane. She returns for an evaluation. She does report some difficulty controlling the bladder. She recently had MRI evaluation of the cervical spine done at [facility], she was told she had spondylosis, no spinal cord compression. I do not have the results of the study. The patient is reporting some difficulty with swallowing, particularly with liquids.  Past Medical History  Diagnosis Date  . Wrist pain 2014  . Abnormality of gait 04/13/2014  . Hypothyroid   . Dyslipidemia   . Obese   . Depression   . Osteopenia   . Breast cancer     s/p radiation  . Meniere's disease   . Parkinsonism 12/24/2014    Past Surgical History  Procedure Laterality Date  . Laparoscopic vaginal hysterectomy  1984  . Mastectomy  2010    breast cancer, left  . Neck surgery  1999    cervical procedure  . Oophorectomy  1984  . Appendectomy      Family History  Problem Relation Age of Onset  . Dementia Mother   . COPD Mother     . Parkinsonism Mother   . Stroke Mother   . Heart disease Father   . Heart disease Brother   . Heart disease Sister     Social history:  reports that she has never smoked. She has never used smokeless tobacco. She reports that she drinks alcohol. She reports that she does not use illicit drugs.   No Known Allergies  Medications:  Current Outpatient Prescriptions on File Prior to Visit  Medication Sig Dispense Refill  . alendronate (FOSAMAX) 70 MG tablet Take 70 mg by mouth once a week. Take with a full glass of water on an empty stomach. On Sunday    . aspirin EC 81 MG tablet Take 81 mg by mouth daily.    Marland Kitchen atorvastatin (LIPITOR) 40 MG tablet Take 40 mg by mouth daily.    . citalopram (CELEXA) 20 MG tablet Take 10 mg by mouth daily.     Marland Kitchen levothyroxine (SYNTHROID, LEVOTHROID) 125 MCG tablet Take 125 mcg by mouth daily before breakfast.    . Multiple Vitamin (MULTIVITAMIN) tablet Take 1 tablet by mouth daily.     No current facility-administered medications on file prior to visit.    ROS:  Out of a complete 14 system review of symptoms, the patient complains only of the following symptoms, and all other reviewed systems are negative.  Activity change, increased weight Leg swelling Insomnia, apnea,  frequent waking, daytime sleepiness Memory loss, headache, numbness, speech difficulty  Blood pressure 126/77, pulse 91, height 5\' 9"  (1.753 m), weight 261 lb 3.2 oz (118.48 kg).  Physical Exam  General: The patient is alert and cooperative at the time of the examination. The patient is moderately obese.  Skin: No significant peripheral edema is noted.   Neurologic Exam  Mental status: The patient is oriented x 3.  Cranial nerves: Facial symmetry is present. Speech is slightly dysphonic. Extraocular movements are full. Visual fields are full. Masking of the face is seen.  Motor: The patient has good strength in all 4 extremities.  Sensory examination: Soft touch sensation  is symmetric on the face, arms, and legs.  Coordination: The patient has good finger-nose-finger and heel-to-shin bilaterally.  Gait and station: The patient has a normal gait, good stride is noted. The patient will swing arms well bilaterally with walking. No tremor is seen. Tandem gait is unsteady. Romberg is negative. No drift is seen.  Reflexes: Deep tendon reflexes are symmetric.   Assessment/Plan:  1. Parkinsonism  2. Gait disorder  The patient is having ongoing issues with parkinsonism, but her ability to walk does not look typical for a Parkinson's disease patient. She takes good stride, has good arm swing, but she continues to have multiple falls. She has masking of the face, dysphonic speech. No eye movement abnormalities are seen. 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 It is not clear that she responded to Sinemet, but she was on a very low dose. I will try her on Rytary. If she tolerates a low dose of the medication after several weeks, she is to contact our office, and we will go up on the dose. I will try to get a second opinion regarding her movement disorder issue.    Jill Alexanders MD 12/24/2014 8:14 PM  [facility] [address] [place], [place] [address]  Phone [phone] Fax [phone]

## 2014-12-24 NOTE — Patient Instructions (Signed)

## 2014-12-28 ENCOUNTER — Telehealth: Payer: Self-pay | Admitting: Neurology

## 2014-12-28 NOTE — Telephone Encounter (Signed)
Called patient to schedule second opinion appointment with  Dr Rexene Alberts, no answer, could not leave Vm , will try later

## 2014-12-29 ENCOUNTER — Telehealth: Payer: Self-pay | Admitting: Neurology

## 2014-12-29 NOTE — Telephone Encounter (Signed)
Called and scheduled second opinion appt. ( Dr Rexene Alberts), confirmed and patient wanted to cancel her appointment with Dr Jefferey Pica), and rescheduled to a later.

## 2014-12-30 NOTE — Telephone Encounter (Signed)
Appointment has been confirmed and scheduled

## 2015-01-14 ENCOUNTER — Ambulatory Visit: Payer: Medicare Other | Admitting: Physical Therapy

## 2015-01-26 ENCOUNTER — Ambulatory Visit: Payer: Medicare Other | Admitting: Physical Therapy

## 2015-02-22 ENCOUNTER — Institutional Professional Consult (permissible substitution): Payer: Self-pay | Admitting: Neurology

## 2015-03-01 ENCOUNTER — Institutional Professional Consult (permissible substitution): Payer: Self-pay | Admitting: Neurology

## 2015-03-18 ENCOUNTER — Encounter: Payer: Self-pay | Admitting: Neurology

## 2015-03-18 ENCOUNTER — Ambulatory Visit (INDEPENDENT_AMBULATORY_CARE_PROVIDER_SITE_OTHER): Payer: Medicare Other | Admitting: Neurology

## 2015-03-18 VITALS — BP 117/79 | HR 78 | Resp 16 | Ht 71.0 in | Wt 252.0 lb

## 2015-03-18 DIAGNOSIS — R413 Other amnesia: Secondary | ICD-10-CM | POA: Diagnosis not present

## 2015-03-18 DIAGNOSIS — G20 Parkinson's disease: Secondary | ICD-10-CM

## 2015-03-18 DIAGNOSIS — R269 Unspecified abnormalities of gait and mobility: Secondary | ICD-10-CM

## 2015-03-18 NOTE — Progress Notes (Addendum)
Subjective:    Patient ID: Paige Murphy is a 69 y.o. female.  HPI     Star Age, MD, PhD [facility] [address] [place],  [address]  Dear Paige Murphy,   I saw your patient, Paige Murphy, upon your kind request in my clinic today for second opinion of her gait disorder and parkinsonism. The patient is accompanied by her sister, Mariann Laster, today. As you know, Paige Murphy is a 69 year old right-handed woman with an underlying medical history of hypothyroidism, obesity, hyperlipidemia, depression, osteopenia, breast cancer, status post left mastectomy and radiation treatment, wrist pain, and Mnire's disease, who has had a gait disorder for years, at least 2 years with progression. She has had recurrent falls. She has had evidence of parkinsonism and you tried her on Sinemet and while she titrated this up to 3 times a day at the 25-100 milligrams strength she felt more sedated and had no significant improvement in her mobility. She has had more slurring of speech and gradual difficulty with swallowing as well. She has neck pain. She had an MRI of her neck at [facility]. She had a brain MRI without contrast on 10/27/2013 which I reviewed: This was done at [facility] and showed mild atrophy and small vessel disease.  She has a FHx of PD in mother (73 yo in a NH), and MGM also had PD and her father was also diagnosed with PD about a year before he passed at 15 (from CHF). She has 3 sisters and one brother. They take turns in seeing their mother.  She drives, shorter distances, not at night, no interstates. She has noted some short term memory issues. She lives alone and has one grown son. She does not cook or clean and her sisters help out. Her son lives about 20 minutes away.  She had a [place] on 03/05/15 at [facility]. We will request the results. She has had recurrent falls, her most recent fall was on 03/05/2015 and one of her other  sisters took her to the hospital. She fell on her lower back as I understand, she did not have loss of consciousness or head injury.  Her Past Medical History Is Significant For: Past Medical History  Diagnosis Date  . Wrist pain 2014  . Abnormality of gait 04/13/2014  . Hypothyroid   . Dyslipidemia   . Obese   . Depression   . Osteopenia   . Breast cancer     s/p radiation  . Meniere's disease   . Parkinsonism 12/24/2014  . Sleep apnea   . TIA (transient ischemic attack)   . Osteopenia   . DJD (degenerative joint disease)   . Anxiety   . Depression     Her Past Surgical History Is Significant For: Past Surgical History  Procedure Laterality Date  . Laparoscopic vaginal hysterectomy  1984  . Mastectomy  2010    breast cancer, left  . Neck surgery  1999    cervical procedure  . Oophorectomy  1984  . Appendectomy      Her Family History Is Significant For: Family History  Problem Relation Age of Onset  . Dementia Mother   . COPD Mother   . Parkinsonism Mother   . Stroke Mother   . Heart disease Father   . Heart disease Brother   . Heart disease Sister     Her Social History Is Significant For: History   Social History  . Marital Status: Divorced  Spouse Name: N/A  . Number of Children: 1  . Years of Education: college   Occupational History  . retired    Social History Main Topics  . Smoking status: Former Smoker    Start date: 11/27/1978  . Smokeless tobacco: Never Used  . Alcohol Use: 0.0 oz/week    0 Standard drinks or equivalent per week     Comment: occasional glass of wine  . Drug Use: No  . Sexual Activity: Not on file   Other Topics Concern  . None   Social History Narrative   Patient is right handed   Patient drinks 4-5 cups caffeine daily.       Her Allergies Are:  No Known Allergies:   Her Current Medications Are:  Outpatient Encounter Prescriptions as of 03/18/2015  Medication Sig  . alendronate (FOSAMAX) 70 MG tablet Take  70 mg by mouth once a week. Take with a full glass of water on an empty stomach. On Sunday  . aspirin EC 81 MG tablet Take 81 mg by mouth daily.  Marland Kitchen atorvastatin (LIPITOR) 40 MG tablet Take 40 mg by mouth daily.  . Carbidopa-Levodopa ER (RYTARY) 23.75-95 MG CPCR Take 1 tablet by mouth 3 (three) times daily.  Marland Kitchen levothyroxine (SYNTHROID, LEVOTHROID) 125 MCG tablet Take 125 mcg by mouth daily before breakfast.  . Multiple Vitamin (MULTIVITAMIN) tablet Take 1 tablet by mouth daily.  . [DISCONTINUED] citalopram (CELEXA) 20 MG tablet Take 10 mg by mouth daily.   :   Review of Systems:  Out of a complete 14 point review of systems, all are reviewed and negative with the exception of these symptoms as listed below:  Review of Systems  Constitutional: Positive for fatigue and unexpected weight change.  HENT: Positive for hearing loss, tinnitus and trouble swallowing.        Spinning sensation  Eyes: Positive for visual disturbance.  Respiratory: Positive for shortness of breath.   Genitourinary:       Incontinence  Musculoskeletal: Positive for myalgias and arthralgias.  Neurological: Positive for dizziness, speech difficulty, weakness and numbness.       Snoring, saw sleep dr yesterday, doctor reported that she slept more than four hours with cpap  Hematological: Bruises/bleeds easily.  Psychiatric/Behavioral: Positive for sleep disturbance.       Depression, anxiety, not enough sleep, decreased energy, disinterest in activities    Objective:  Neurologic Exam  Physical Exam Physical Examination:   Filed Vitals:   03/18/15 1413  BP: 117/79  Pulse: 78  Resp: 16    General Examination: The patient is a very pleasant 69 y.o. female in no acute distress.  HEENT: Normocephalic, atraumatic, pupils are equal, round and reactive to light and accommodation. Funduscopic exam is normal with sharp disc margins noted. Extraocular tracking shows mild saccadic breakdown without nystagmus noted.  There is limitation to upper, and lower gaze. There is mild decrease in eye blink rate. Hearing is intact. Face is symmetric with mild facial masking and normal facial sensation. There is no lip, neck or jaw tremor. Neck is mildly to moderately rigid with intact passive ROM. There are no carotid bruits on auscultation. Oropharynx exam reveals moderate mouth dryness. No significant airway crowding is noted. Mallampati is class II. Tongue protrudes centrally and palate elevates symmetrically.   There is no drooling.   Chest: is clear to auscultation without wheezing, rhonchi or crackles noted.  Heart: sounds are regular and normal without murmurs, rubs or gallops noted.   Abdomen: is  soft, non-tender and non-distended with normal bowel sounds appreciated on auscultation.  Extremities: There is trace pitting edema in the distal lower extremities bilaterally. Pedal pulses are intact. There are no varicose veins.  Skin: is warm and dry with no trophic changes noted. Age-related changes are noted on the skin.   Musculoskeletal: exam reveals no obvious joint deformities, tenderness, joint swelling or erythema.  Neurologically:  Mental status: The patient is awake and alert, paying good  attention. She is able to completely provide the history. Her sister provides details. She is oriented to: person, place, time/date, situation, day of week, month of year and year. Her memory, attention, language and knowledge are fairly well preserved, there is slowness in thinking/bradyphrenia. Speech is moderately hypophonic with mild dysarthria noted. Mood is congruent and affect is blunted.   Cranial nerves are as described above under HEENT exam. In addition, shoulder shrug is normal with equal shoulder height noted.  Motor exam: Normal bulk, and strength for age is noted. There are no  Tone is mildly rigid with absence of cogwheeling. There is overall mild bradykinesia. There is no drift or rebound.  There is no  tremor.  Romberg is not possible to test.  Reflexes are 1+ in the upper extremities and 1+ in the lower extremities. Toes are downgoing bilaterally.  Fine motor skills exam: Finger taps are mildly impaired on the right and mildly impaired on the left. Hand movements are mildly impaired on the right and mildly impaired on the left. RAP (rapid alternating patting) is mildly impaired on the right and mildly impaired on the left. Foot taps are mildly impaired on the right and mildly impaired on the left. Foot agility (in the form of heel stomping) is mildly impaired on the right and mildly impaired on the left.    Cerebellar testing shows no dysmetria or intention tremor on finger to nose testing. Heel to shin is unremarkable bilaterally. There is no truncal or gait ataxia.   Sensory exam is intact to light touch, pinprick, vibration, temperature sense and proprioception in the upper and lower extremities.   Gait, station and balance: She stands up from the seated position with mild difficulty and does need to push up with Her hands. She needs no assistance. No veering to one side is noted. She is not noted to lean to the side. Posture is mildly stooped for age. Stance is wide-based. She walks with decrease in stride length and pace and decreased arm swing b/l. She turns in 3 steps with mild insecurity noted. Balance is mildly impaired. Tandem walk is not possible for her. She walks with a three-pronged cane.  Assessment and Plan:   In summary, Alliyah Roesler is a very pleasant 69 y.o.-year old female with an underlying medical history of hypothyroidism, obesity, hyperlipidemia, depression, osteopenia, breast cancer, status post left mastectomy and radiation treatment, wrist pain, and Mnire's disease, who has had a gait disorder for years, at least 2 years with progression. She has had recurrent falls. She has evidence of parkinsonism. Her history and physical exam are in keeping with mild parkinsonism,  without obvious lateralization. She has had falls and does not typically fall backwards. She has had some mild memory issues. She has some milder swallowing issues at times. All in all, I agree with you that she does not have idiopathic Parkinson's disease. In addition, she did not have a favorable response with Sinemet. 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 She is advised about  this diagnosis, its prognosis and treatment options. She understands that there is no curative treatment for any parkinsonian disorder and symptomatic treatment is limited. Supportive care is keep. She has had some physical therapy last year. She may be eligible for physical therapy which I told her to discuss with you. In addition, she is thinking about selling her house and moving into a smaller apartment. She lives alone and I have asked her to consider getting a call alert her life alert button. Her sister has one as well. Interestingly, she does have a family history of Parkinson's disease in both parents from what I understand. We will request her most recent head CT results. Keeping her safe is keep. She is advised to use her walker or cane at all times. She is advised to be better hydrated. She is advised you can consider a DaT scan. I think you prescribed rytary in January, but the patient did not take it. Her sister recalls that it was too expensive for her to fill it. At this juncture, she is advised to continue to stay active mentally and physically, stay well hydrated, eat healthy. She is advised to consider physical therapy as an outpatient again to help with her balance and gait disorder. I would shy away from recommending any other antiparkinsonian medication at this time.  Thank you very much for allowing me to participate in the care of this nice patient. If I can be of any further assistance to you please do not hesitate to talk to me.   Sincerely,   Star Age, MD, PhD  I spent 30  minutes in total face-to-face time with the patient, more than 50% of which was spent in counseling and coordination of care, reviewing test results, reviewing medication and discussing or reviewing the diagnosis of Parkinsonism, its prognosis and treatment options.  03/19/2015: I reviewed the head CT report from [facility] from 03/05/2015: Impression: Normal head CT.

## 2015-03-18 NOTE — Patient Instructions (Signed)
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 Supportive care and safety are key. I will give my recommendations to Dr. Jannifer Franklin. Keep your appointment in July.

## 2015-03-18 NOTE — Telephone Encounter (Signed)
This encounter was created in error - please disregard.

## 2015-05-20 ENCOUNTER — Ambulatory Visit: Payer: Medicare Other | Admitting: Neurology

## 2015-06-08 ENCOUNTER — Ambulatory Visit: Payer: Self-pay | Admitting: Neurology

## 2015-06-22 ENCOUNTER — Telehealth: Payer: Self-pay

## 2015-06-22 ENCOUNTER — Ambulatory Visit: Payer: Self-pay | Admitting: Neurology

## 2015-06-22 NOTE — Telephone Encounter (Signed)
Patient canceled appointment within 24 hours of appointment time.

## 2015-08-17 ENCOUNTER — Ambulatory Visit: Payer: Self-pay | Admitting: Neurology

## 2015-10-27 ENCOUNTER — Encounter: Payer: Self-pay | Admitting: Neurology

## 2015-10-27 ENCOUNTER — Ambulatory Visit (INDEPENDENT_AMBULATORY_CARE_PROVIDER_SITE_OTHER): Payer: Medicare Other | Admitting: Neurology

## 2015-10-27 VITALS — BP 128/86 | HR 80 | Ht 69.0 in | Wt 240.5 lb

## 2015-10-27 DIAGNOSIS — G231 Progressive supranuclear ophthalmoplegia [Steele-Richardson-Olszewski]: Secondary | ICD-10-CM

## 2015-10-27 DIAGNOSIS — R1310 Dysphagia, unspecified: Secondary | ICD-10-CM | POA: Diagnosis not present

## 2015-10-27 DIAGNOSIS — G4733 Obstructive sleep apnea (adult) (pediatric): Secondary | ICD-10-CM | POA: Diagnosis not present

## 2015-10-27 DIAGNOSIS — G218 Other secondary parkinsonism: Secondary | ICD-10-CM

## 2015-10-27 DIAGNOSIS — R269 Unspecified abnormalities of gait and mobility: Secondary | ICD-10-CM

## 2015-10-27 DIAGNOSIS — Z9989 Dependence on other enabling machines and devices: Secondary | ICD-10-CM

## 2015-10-27 HISTORY — DX: Progressive supranuclear ophthalmoplegia (steele-Richardson-olszewski): G23.1

## 2015-10-27 MED ORDER — CARBIDOPA-LEVODOPA ER 25-100 MG PO TBCR: EXTENDED_RELEASE_TABLET | ORAL | Status: DC

## 2015-10-27 NOTE — Progress Notes (Signed)
Reason for visit: Parkinsonism  Paige Murphy is an 69 y.o. female  History of present illness:  Paige Murphy is a 69 year old right-handed white female with a history of a progressive gait disorder. The patient has features of parkinsonism, but she does not appear to have Parkinson's disease. She was felt possibly to have PSP. She was sent for second opinion to see Dr. Rexene Alberts, who agreed with my original assessment. She has been placed on Sinemet, but she has not been able to tolerate even low doses of the medication because of excessive drowsiness. The patient has sleep apnea, she is on CPAP, but she has not been using her CPAP as she has lost access to her sleep physician. The patient indicates that she will get to sleep easily, but she wakes up 3 or 4 hours later, goes to the bathroom, and then cannot get back to sleep. She is having only about 4 or 5 hours of sleep at night. The patient remains sleepy and drowsy throughout the day. She has also reported ongoing issues with falling, she is falling about once a week, she uses a quad cane for ambulation. She denies issues controlling the bowels or the bladder. She has had some problems with drooling, and she reports difficulty with swallowing and choking. This has also gotten worse over time. She has noted that she has had a change in personality, she is becoming more irritable, she is laughing or crying inappropriately at times. The patient does have a mild memory disturbance as well. She is having increasing problems with reading, things seem to be somewhat blurred. She denies any double vision.  Past Medical History  Diagnosis Date  . Wrist pain 2014  . Abnormality of gait 04/13/2014  . Hypothyroid   . Dyslipidemia   . Obese   . Depression   . Osteopenia   . Breast cancer [facility])     s/p radiation  . Meniere's disease   . Parkinsonism ([place]) 12/24/2014  . Sleep apnea   . TIA (transient ischemic attack)   . Osteopenia   . DJD (degenerative  joint disease)   . Anxiety   . Depression   . PSP (progressive supranuclear palsy) ([place]) 10/27/2015    Past Surgical History  Procedure Laterality Date  . Laparoscopic vaginal hysterectomy  1984  . Mastectomy  2010    breast cancer, left  . Neck surgery  1999    cervical procedure  . Oophorectomy  1984  . Appendectomy      Family History  Problem Relation Age of Onset  . Dementia Mother   . COPD Mother   . Parkinsonism Mother   . Stroke Mother   . Heart disease Father   . Heart disease Brother   . Heart disease Sister     Social history:  reports that she has quit smoking. She started smoking about 36 years ago. She has never used smokeless tobacco. She reports that she does not drink alcohol or use illicit drugs.   No Known Allergies  Medications:  Prior to Admission medications   Medication Sig Start Date End Date Taking? Authorizing Provider  alendronate (FOSAMAX) 70 MG tablet Take 70 mg by mouth once a week. Take with a full glass of water on an empty stomach. On Sunday   Yes Historical Provider, MD  aspirin EC 81 MG tablet Take 81 mg by mouth daily.   Yes Historical Provider, MD  atorvastatin (LIPITOR) 40 MG tablet Take 40 mg by mouth  daily.   Yes Historical Provider, MD  levothyroxine (SYNTHROID, LEVOTHROID) 125 MCG tablet Take 125 mcg by mouth daily before breakfast.   Yes Historical Provider, MD  Multiple Vitamin (MULTIVITAMIN) tablet Take 1 tablet by mouth daily.   Yes Historical Provider, MD  Carbidopa-Levodopa ER (RYTARY) 23.75-95 MG CPCR Take 1 tablet by mouth 3 (three) times daily. Patient not taking: Reported on 10/27/2015 12/24/14   Kathrynn Ducking, MD    ROS:  Out of a complete 14 system review of symptoms, the patient complains only of the following symptoms, and all other reviewed systems are negative.  Fatigue Difficulty swallowing, drooling Double vision, loss of vision, blurred vision Shortness of breath, choking Sleep apnea, daytime  sleepiness, snoring Frequency of urination, urinary urgency Joint pain Bruising easily Memory loss, dizziness, speech difficulty, weakness, facial drooping Agitation, decreased concentration, irritability, depression  Blood pressure 128/86, pulse 80, height 5\' 9"  (1.753 m), weight 240 lb 8 oz (109.09 kg).  Physical Exam  General: The patient is alert and cooperative at the time of the examination. The patient is moderately obese.  Skin: No significant peripheral edema is noted.   Neurologic Exam  Mental status: The patient is alert and oriented x 3 at the time of the examination. The patient has apparent normal recent and remote memory, with an apparently normal attention span and concentration ability. Mini-Mental Status Examination done today shows a total score of 30/30. The patient is able to name 6 animals in 30 seconds.   Cranial nerves: Facial symmetry is present. Speech is slightly dysarthric, not aphasic. Extraocular movements are full, with exception of some restriction of superior gaze. The patient has end gaze nystagmus bilaterally. Visual fields are full. Masking of the face is seen.  Motor: The patient has good strength in all 4 extremities.  Sensory examination: Soft touch sensation is symmetric on the face, arms, and legs.  Coordination: The patient has good finger-nose-finger and heel-to-shin bilaterally.  Gait and station: The patient has the ability to ambulate independently, she usually uses a quad cane. She has some slight instability with turns, tandem gait is unstable. Romberg is negative. The patient does not have a stooped posture. She has arm swing with walking.  Reflexes: Deep tendon reflexes are symmetric.   Assessment/Plan:  1. 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 Gait disorder  3. Sleep apnea on CPAP  4. Mild memory disturbance  5. Dysphagia  The patient is having some new issues with swallowing, she continues to have a  gait disorder. She has had difficulty tolerating Sinemet, in part because she is drowsy throughout the day related to her sleep disturbance. She will be sent for a sleep evaluation, she currently does not have a sleep physician. She is on CPAP. Given the history of dysphagia, she will be sent for a modified barium swallow. She will be retried on low-dose Sinemet, taking the CR tablet 25/100 at nighttime, she will do this for 2 weeks, and then go to 1 twice a day. Previously, she was placed on Rytary, but it is not apparent whether or not she actually took the medication. She will follow-up in 4 or 5 months. Unfortunately, PSP is not treatable. It usually does not respond much to medication.   Jill Alexanders MD 10/28/2015 3:04 PM  [facility] [address] [place], [place] [address]  Phone [phone] Fax [phone]

## 2015-10-27 NOTE — Patient Instructions (Signed)
Progressive Supranuclear Palsy Progressive supranuclear palsy is a rare brain disorder that causes a gradual deterioration of cells at the base of the brain. It causes serious problems with walking, eye movement, and balance. It may also cause behavior changes, depression, and loss of mental capacity (dementia).  Progressive supranuclear palsy tends to get worse over time.  CAUSES The exact cause of progressive supranuclear palsy is not known. In some cases, the condition may be caused by genes passed down through families (inherited). RISK FACTORS Progressive supranuclear palsy usually begins after age 60 and is slightly more common in men. SIGNS AND SYMPTOMS  The pattern of signs and symptoms can vary greatly from person to person. Signs and symptoms may include:  Loss of balance while walking.  Walking that is stiff and awkward.  Unexplained falls.  Inability to focus the eyes properly or blurred vision.  Changes in mood and behavior. These include:  Lack of feeling or emotion (apathy).  Irritability.  Sudden laughing or crying.  Personality changes.  Progressive mild dementia.  Difficulty swallowing and speaking. DIAGNOSIS  Your health care provider can diagnose progressive supranuclear palsy from your signs and symptoms. The main symptoms your health care provider will check for are:  Poor balance.  Vision problems.  Slurred speech.  Mental or behavioral changes. Your health care provider may also do some tests to rule out other causes of your symptoms. This may include checking your spinal fluid for abnormal proteins and taking images of your brain to look for areas of nerve cell loss. TREATMENT There is no cure for progressive supranuclear palsy. No treatment will slow the progression of the disease. Your treatment will be based on your symptoms. Treatment may include:  Medicines for Parkinson's disease. These may help with slowness, stiffness, and balance  problems.  Antidepressant medicines to treat depression.  Glasses called prisms to help with blurry vision.  Walking aids to prevent falls.  A surgical procedure to place a feeding tube into your stomach (gastrostomy) if swallowing becomes very hard. HOME CARE INSTRUCTIONS Work closely with your team of health care providers. Your team may include:  A physical therapist. Have him or her help you come up with a safe exercise program.  A speech and language therapist. Have him or her teach you ways to swallow foods and liquids safely. This specialist can also help you speak more clearly.  An occupational therapist. Have him or her help you learn to use walking aids and make your home safe. SEEK MEDICAL CARE IF:  You have chills or fever.  Your symptoms are getting worse.  You develop new symptoms.  You are having trouble getting enough nutrition.  You have a cough that will not go away.  You have shortness of breath.  You are anxious or depressed.  You are not getting enough support at home. SEEK IMMEDIATE MEDICAL CARE IF:  You choke, cough, or have trouble breathing after eating or drinking.  You hurt yourself in a fall.  You no longer feel safe at home.   This information is not intended to replace advice given to you by your health care provider. Make sure you discuss any questions you have with your health care provider.   Document Released: 11/03/2002 Document Revised: 12/04/2014 Document Reviewed: 11/12/2013 Elsevier Interactive Patient Education 2016 [employer]vier Inc[employer]  

## 2015-11-08 ENCOUNTER — Other Ambulatory Visit (HOSPITAL_COMMUNITY): Payer: Self-pay | Admitting: Neurology

## 2015-11-08 DIAGNOSIS — R1310 Dysphagia, unspecified: Secondary | ICD-10-CM

## 2015-11-15 ENCOUNTER — Institutional Professional Consult (permissible substitution): Payer: Medicare Other | Admitting: Neurology

## 2015-12-01 ENCOUNTER — Telehealth: Payer: Self-pay | Admitting: Neurology

## 2015-12-01 DIAGNOSIS — G4733 Obstructive sleep apnea (adult) (pediatric): Secondary | ICD-10-CM

## 2015-12-01 DIAGNOSIS — Z9989 Dependence on other enabling machines and devices: Principal | ICD-10-CM

## 2015-12-01 NOTE — Telephone Encounter (Signed)
I called the patient 2x but only got busy tone.  Patient has pending Sleep Consult appt 12/07/15. The patient has only 2 options: 1. See Dr. Rexene Alberts for consult and have sleep study completed here or 2. We can refer her to [facility] or a sleep lab and sleep specialist in her area.  I will try to call her back later.

## 2015-12-01 NOTE — Telephone Encounter (Signed)
Pt is inquiring if Paige Murphy @ [facility] in [place] has called to have sleep study scheduled in [place] or close to there. Pt would like it to done in [place] or [place].

## 2015-12-02 ENCOUNTER — Ambulatory Visit (HOSPITAL_COMMUNITY)
Admission: RE | Admit: 2015-12-02 | Discharge: 2015-12-02 | Disposition: A | Discharge status: Home / Self Care | Payer: Medicare Other | Source: Ambulatory Visit | Attending: Neurology | Admitting: Neurology

## 2015-12-02 DIAGNOSIS — R1310 Dysphagia, unspecified: Secondary | ICD-10-CM

## 2015-12-02 DIAGNOSIS — G20 Parkinson's disease: Secondary | ICD-10-CM | POA: Diagnosis not present

## 2015-12-02 DIAGNOSIS — G231 Progressive supranuclear ophthalmoplegia [Steele-Richardson-Olszewski]: Secondary | ICD-10-CM | POA: Insufficient documentation

## 2015-12-02 NOTE — Telephone Encounter (Signed)
Called again 2x but no answer and no voice mail. Patient has not seen Dr. Rexene Alberts yet (Dr. Jannifer Franklin patient referred for sleep consult). Please see below, patient has 2 options.... See Dr. Rexene Alberts here and complete sleep study here or she can be referred to sleep doctor in [place] to do sleep study there.

## 2015-12-07 ENCOUNTER — Institutional Professional Consult (permissible substitution): Payer: Medicare Other | Admitting: Neurology

## 2015-12-07 NOTE — Addendum Note (Signed)
Addended by: Laurence Spates on: 12/07/2015 10:43 AM   Modules accepted: Orders

## 2015-12-07 NOTE — Telephone Encounter (Signed)
I called patient again and gave her the options below. She would like to be seen in [place] and have sleep study done in [place]. I will put new referral in.

## 2015-12-08 ENCOUNTER — Telehealth: Payer: Self-pay | Admitting: Neurology

## 2015-12-08 NOTE — Telephone Encounter (Signed)
This patient apparently has had a sleep study done through [facility], done by Dr. Adriana Reams. The study is consistent with sleep apnea, CPAP was recommended with 8 cm of water.

## 2015-12-15 NOTE — Progress Notes (Signed)
   12/02/15 1300  SLP G-Codes **NOT FOR INPATIENT CLASS**  Functional Assessment Tool Used clinical judgment  Functional Limitations Swallowing  Swallow Current Status [id number]) CI  Swallow Goal Status [id number]) CI  Swallow Discharge Status [id number]) CI  SLP Evaluations  $ SLP Speech Visit 1 Procedure  SLP Evaluations  $Swallowing Treatment 1 Procedure  $MBS Swallow Outpatient 1 Procedure

## 2016-01-19 ENCOUNTER — Other Ambulatory Visit: Payer: Self-pay | Admitting: Physician Assistant

## 2016-01-19 DIAGNOSIS — R928 Other abnormal and inconclusive findings on diagnostic imaging of breast: Secondary | ICD-10-CM

## 2016-01-28 ENCOUNTER — Ambulatory Visit
Admission: RE | Admit: 2016-01-28 | Discharge: 2016-01-28 | Disposition: A | Discharge status: Home / Self Care | Payer: Medicare Other | Source: Ambulatory Visit | Attending: Physician Assistant | Admitting: Physician Assistant

## 2016-01-28 DIAGNOSIS — R928 Other abnormal and inconclusive findings on diagnostic imaging of breast: Secondary | ICD-10-CM

## 2016-02-29 ENCOUNTER — Ambulatory Visit: Payer: Medicare Other | Admitting: Urology

## 2016-03-22 ENCOUNTER — Ambulatory Visit: Payer: Medicare Other | Admitting: Neurology

## 2016-04-05 ENCOUNTER — Ambulatory Visit: Payer: Medicare Other | Admitting: Neurology

## 2016-05-15 ENCOUNTER — Encounter: Payer: Self-pay | Admitting: Neurology

## 2016-05-15 ENCOUNTER — Ambulatory Visit (INDEPENDENT_AMBULATORY_CARE_PROVIDER_SITE_OTHER): Payer: Medicare Other | Admitting: Neurology

## 2016-05-15 VITALS — BP 138/90 | HR 78 | Ht 69.0 in | Wt 210.0 lb

## 2016-05-15 DIAGNOSIS — R5382 Chronic fatigue, unspecified: Secondary | ICD-10-CM

## 2016-05-15 DIAGNOSIS — G231 Progressive supranuclear ophthalmoplegia [Steele-Richardson-Olszewski]: Secondary | ICD-10-CM | POA: Diagnosis not present

## 2016-05-15 DIAGNOSIS — R269 Unspecified abnormalities of gait and mobility: Secondary | ICD-10-CM | POA: Diagnosis not present

## 2016-05-15 HISTORY — DX: Chronic fatigue, unspecified: R53.82

## 2016-05-15 NOTE — Patient Instructions (Signed)
Fall Prevention in the Home  Falls can cause injuries and can affect people from all age groups. There are many simple things that you can do to make your home safe and to help prevent falls. WHAT CAN I DO ON THE OUTSIDE OF MY HOME?  Regularly repair the edges of walkways and driveways and fix any cracks.  Remove high doorway thresholds.  Trim any shrubbery on the main path into your home.  Use bright outdoor lighting.  Clear walkways of debris and clutter, including tools and rocks.  Regularly check that handrails are securely fastened and in good repair. Both sides of any steps should have handrails.  Install guardrails along the edges of any raised decks or porches.  Have leaves, snow, and ice cleared regularly.  Use sand or salt on walkways during winter months.  In the garage, clean up any spills right away, including grease or oil spills. WHAT CAN I DO IN THE BATHROOM?  Use night lights.  Install grab bars by the toilet and in the tub and shower. Do not use towel bars as grab bars.  Use non-skid mats or decals on the floor of the tub or shower.  If you need to sit down while you are in the shower, use a plastic, non-slip stool..  Keep the floor dry. Immediately clean up any water that spills on the floor.  Remove soap buildup in the tub or shower on a regular basis.  Attach bath mats securely with double-sided non-slip rug tape.  Remove throw rugs and other tripping hazards from the floor. WHAT CAN I DO IN THE BEDROOM?  Use night lights.  Make sure that a bedside light is easy to reach.  Do not use oversized bedding that drapes onto the floor.  Have a firm chair that has side arms to use for getting dressed.  Remove throw rugs and other tripping hazards from the floor. WHAT CAN I DO IN THE KITCHEN?   Clean up any spills right away.  Avoid walking on wet floors.  Place frequently used items in easy-to-reach places.  If you need to reach for something  above you, use a sturdy step stool that has a grab bar.  Keep electrical cables out of the way.  Do not use floor polish or wax that makes floors slippery. If you have to use wax, make sure that it is non-skid floor wax.  Remove throw rugs and other tripping hazards from the floor. WHAT CAN I DO IN THE STAIRWAYS?  Do not leave any items on the stairs.  Make sure that there are handrails on both sides of the stairs. Fix handrails that are broken or loose. Make sure that handrails are as long as the stairways.  Check any carpeting to make sure that it is firmly attached to the stairs. Fix any carpet that is loose or worn.  Avoid having throw rugs at the top or bottom of stairways, or secure the rugs with carpet tape to prevent them from moving.  Make sure that you have a light switch at the top of the stairs and the bottom of the stairs. If you do not have them, have them installed. WHAT ARE SOME OTHER FALL PREVENTION TIPS?  Wear closed-toe shoes that fit well and support your feet. Wear shoes that have rubber soles or low heels.  When you use a stepladder, make sure that it is completely opened and that the sides are firmly locked. Have someone hold the ladder while you   are using it. Do not climb a closed stepladder.  Add color or contrast paint or tape to grab bars and handrails in your home. Place contrasting color strips on the first and last steps.  Use mobility aids as needed, such as canes, walkers, scooters, and crutches.  Turn on lights if it is dark. Replace any light bulbs that burn out.  Set up furniture so that there are clear paths. Keep the furniture in the same spot.  Fix any uneven floor surfaces.  Choose a carpet design that does not hide the edge of steps of a stairway.  Be aware of any and all pets.  Review your medicines with your healthcare provider. Some medicines can cause dizziness or changes in blood pressure, which increase your risk of falling. Talk  with your health care provider about other ways that you can decrease your risk of falls. This may include working with a physical therapist or trainer to improve your strength, balance, and endurance.   This information is not intended to replace advice given to you by your health care provider. Make sure you discuss any questions you have with your health care provider.   Document Released: 11/03/2002 Document Revised: 03/30/2015 Document Reviewed: 12/18/2014 Elsevier Interactive Patient Education 2016 Elsevier Inc.  

## 2016-05-15 NOTE — Progress Notes (Signed)
Reason for visit: PSP  Paige Murphy is an 70 y.o. female  History of present illness:  Paige Murphy is a 70 year old right-handed white female with a history of progressive supranuclear palsy. The patient continues to gradually worsen over time. She lives at her home, her son lives with her but he is not around all the time. The patient has had ongoing issues with falling, she fell around 03/29/2016 in sustained what sounds like an L4 compression fracture that required kyphoplasty done by Dr. Carloyn Manner. The patient continues to fall. She needs assistance with bathing, she cannot cook, she does not operate a motor vehicle. She is able to dress herself. In the house, she has been using a wheelchair when she is alone, she walks with a walker otherwise. She has undergone rehabilitation at a facility, and then 2 weeks of physical and speech therapy in the home environment. She had been on Sinemet in the past but she stopped the medication because it caused dizziness. The patient also complains of significant problems with fatigue. She has sleep apnea but she does not use her CPAP. She has anxiety issues, she will wake up at 2 in the morning, and not be able to fall back asleep until around 6 AM. She returns to this office for an evaluation.  Past Medical History  Diagnosis Date  . Wrist pain 2014  . Abnormality of gait 04/13/2014  . Hypothyroid   . Dyslipidemia   . Obese   . Depression   . Osteopenia   . Breast cancer [facility])     s/p radiation  . Meniere's disease   . Parkinsonism ([place]) 12/24/2014  . Sleep apnea   . TIA (transient ischemic attack)   . Osteopenia   . DJD (degenerative joint disease)   . Anxiety   . Depression   . PSP (progressive supranuclear palsy) ([place]) 10/27/2015  . Chronic fatigue 05/15/2016    Past Surgical History  Procedure Laterality Date  . Laparoscopic vaginal hysterectomy  1984  . Mastectomy  2010    breast cancer, left  . Neck surgery  1999    cervical procedure   . Oophorectomy  1984  . Appendectomy      Family History  Problem Relation Age of Onset  . Dementia Mother   . COPD Mother   . Parkinsonism Mother   . Stroke Mother   . Heart disease Father   . Heart disease Brother   . Heart disease Sister     Social history:  reports that she has quit smoking. She started smoking about 37 years ago. She has never used smokeless tobacco. She reports that she does not drink alcohol or use illicit drugs.   No Known Allergies  Medications:  Prior to Admission medications   Medication Sig Start Date End Date Taking? Authorizing Provider  alendronate (FOSAMAX) 70 MG tablet Take 70 mg by mouth once a week. Take with a full glass of water on an empty stomach. On Sunday    Historical Provider, MD  aspirin EC 81 MG tablet Take 81 mg by mouth daily.    Historical Provider, MD  atorvastatin (LIPITOR) 40 MG tablet Take 40 mg by mouth daily.    Historical Provider, MD  Carbidopa-Levodopa ER (SINEMET CR) 25-100 MG tablet controlled release One tablet at night for 2 weeks, then take one tablet twice a day 10/27/15   Kathrynn Ducking, MD  levothyroxine (SYNTHROID, LEVOTHROID) 125 MCG tablet Take 125 mcg by mouth daily before  breakfast.    Historical Provider, MD  Multiple Vitamin (MULTIVITAMIN) tablet Take 1 tablet by mouth daily.    Historical Provider, MD    ROS:  Out of a complete 14 system review of symptoms, the patient complains only of the following symptoms, and all other reviewed systems are negative.  Decreased activity, fatigue, decreased weight Hearing loss, ringing in the ears, difficulty swallowing, drooling Double vision, blurred vision Cough, choking Constipation Sleep apnea, frequent waking, daytime sleepiness, snoring Incontinence of bladder, frequency of urination, urinary urgency Back pain, walking difficulty Moles Bruising easily Memory loss, dizziness, speech difficulty, weakness, facial drooping Agitation, confusion, decreased  concentration, depression, anxiety  Blood pressure 138/90, pulse 78, height 5\' 9"  (1.753 m), weight 210 lb (95.255 kg).  Physical Exam  General: The patient is alert and cooperative at the time of the examination.  Respiratory: Lung fields are clear.  Cardiovascular: Regular rate and rhythm, no murmurs.  Skin: No significant peripheral edema is noted.   Neurologic Exam  Mental status: The patient is alert and oriented x 3 at the time of the examination. The patient has apparent normal recent and remote memory, with an apparently normal attention span and concentration ability. Mini-Mental Status Examination done today shows a total score 29/30.   Cranial nerves: Facial symmetry is present. Speech is dysarthric, not aphasic. Extraocular movements are notable for coarse saccades in the horizontal plane, there is some restriction of superior and inferior gaze. Visual fields are full. Masking of the face is seen.  Motor: The patient has good strength in all 4 extremities.  Sensory examination: Soft touch sensation is symmetric on the face, arms, and legs.  Coordination: The patient has good finger-nose-finger and heel-to-shin bilaterally.  Gait and station: The patient is able to walk with a walker, has fairly good stability with this. Romberg is negative. Tandem gait was not attempted. With turns, the patient is not using a walker partly, she turns a walker to a 90 angle, and then shuffles around.  Reflexes: Deep tendon reflexes are symmetric.   Assessment/Plan:  1. Progressive supranuclear palsy  2. Gait disorder  The patient is continuing to be a significant fall risk. The patient has poor judgment, she is not using the walker properly. She has undergone rehabilitation as an inpatient, and in the home environment. The patient will need to plan for the future, I have recommended that they start looking at assisted living facilities. The patient needs assistance with bathing and  cooking. She could not tolerate the Sinemet, she is off this medication. The patient will follow-up in 3 months.  Jill Alexanders MD 05/15/2016 6:26 PM  [facility] [address] [place], [place] [address]  Phone [phone] Fax [phone]

## 2016-08-01 ENCOUNTER — Other Ambulatory Visit (HOSPITAL_COMMUNITY)
Admission: RE | Admit: 2016-08-01 | Discharge: 2016-08-01 | Disposition: A | Discharge status: Home / Self Care | Payer: Medicare Other | Source: Skilled Nursing Facility | Attending: Urology | Admitting: Urology

## 2016-08-01 ENCOUNTER — Ambulatory Visit (INDEPENDENT_AMBULATORY_CARE_PROVIDER_SITE_OTHER): Payer: Medicare Other | Admitting: Urology

## 2016-08-01 DIAGNOSIS — R8271 Bacteriuria: Secondary | ICD-10-CM

## 2016-08-01 DIAGNOSIS — R3915 Urgency of urination: Secondary | ICD-10-CM

## 2016-08-04 LAB — URINE CULTURE: Culture: >=100000 — AB

## 2016-08-15 ENCOUNTER — Ambulatory Visit: Payer: Medicare Other | Admitting: Neurology

## 2016-08-30 ENCOUNTER — Ambulatory Visit: Payer: Medicare Other | Admitting: Neurology

## 2016-09-12 ENCOUNTER — Ambulatory Visit: Payer: Medicare Other | Admitting: Urology

## 2017-01-04 ENCOUNTER — Telehealth: Payer: Self-pay | Admitting: Neurology

## 2017-01-04 NOTE — Telephone Encounter (Signed)
Pt's sister called said the pt's palsy is getting worse. An appt was scheduled for 05/28/17. Can she be seen sooner? NP?

## 2017-01-05 NOTE — Telephone Encounter (Signed)
Dr. Willis- FYI 

## 2017-01-05 NOTE — Telephone Encounter (Signed)
Called and spoke to sister, Katharine Look. Scheduled earlier f/u in work in [place] with CW,MD on 01/29/17, at 1215, check in no later than 1200pm. She verbalized understanding and appreciation.

## 2017-01-29 ENCOUNTER — Encounter: Payer: Self-pay | Admitting: Neurology

## 2017-01-29 ENCOUNTER — Ambulatory Visit (INDEPENDENT_AMBULATORY_CARE_PROVIDER_SITE_OTHER): Payer: Medicare Other | Admitting: Neurology

## 2017-01-29 VITALS — BP 130/80 | HR 70 | Ht 69.0 in | Wt 198.5 lb

## 2017-01-29 DIAGNOSIS — F482 Pseudobulbar affect: Secondary | ICD-10-CM | POA: Insufficient documentation

## 2017-01-29 DIAGNOSIS — G231 Progressive supranuclear ophthalmoplegia [Steele-Richardson-Olszewski]: Secondary | ICD-10-CM | POA: Diagnosis not present

## 2017-01-29 DIAGNOSIS — R269 Unspecified abnormalities of gait and mobility: Secondary | ICD-10-CM | POA: Diagnosis not present

## 2017-01-29 HISTORY — DX: Pseudobulbar affect: F48.2

## 2017-01-29 MED ORDER — ZALEPLON 5 MG PO CAPS: 5.0000 mg | ORAL_CAPSULE | Freq: Every evening | ORAL | 2 refills | Status: DC | PRN

## 2017-01-29 NOTE — Patient Instructions (Signed)
   We will use Sonata 5 mg at night if needed for sleep.

## 2017-01-29 NOTE — Progress Notes (Signed)
Reason for visit: PSP  Dustin Kras is an 71 y.o. female  History of present illness:  Ms. Romanoski is a 71 year old right-handed white female with a history of progressive supranuclear palsy. The patient has made a transition from home to the [facility] in [place], [place]. The patient has had some progression of her disease, she is having some difficulty with walking, she will fall on occasion. She is mainly using a wheelchair for mobility currently, she does have a walker to use for ambulation at times. The patient is having increasing problems with swallowing, and with difficulty opening her mouth wide to eat. She is followed through a speech therapist for this. The patient is having coughing and choking with her meals, she is on thickener for the fluids. She is having problems with inappropriate laughing and crying. She cannot stop laughing at times. She has had increasing problems with clumsiness of the hands. She returns to this office for an evaluation. At nighttime, she will go to bed around 8 or 9 PM, she will wake up around 2 AM and she cannot get back to sleep, she is tired during the day. She may take a brief nap in the afternoon.  Past Medical History:  Diagnosis Date  . Abnormality of gait 04/13/2014  . Anxiety   . Breast cancer [facility])    s/p radiation  . Chronic fatigue 05/15/2016  . Depression   . Depression   . DJD (degenerative joint disease)   . Dyslipidemia   . Hypothyroid   . Meniere's disease   . Obese   . Osteopenia   . Osteopenia   . Parkinsonism ([place]) 12/24/2014  . PSP (progressive supranuclear palsy) ([place]) 10/27/2015  . Sleep apnea   . TIA (transient ischemic attack)   . Wrist pain 2014    Past Surgical History:  Procedure Laterality Date  . APPENDECTOMY    . LAPAROSCOPIC VAGINAL HYSTERECTOMY  1984  . MASTECTOMY  2010   breast cancer, left  . NECK SURGERY  1999   cervical procedure  . OOPHORECTOMY  1984    Family History  Problem Relation  Age of Onset  . Dementia Mother   . COPD Mother   . Parkinsonism Mother   . Stroke Mother   . Heart disease Father   . Heart disease Brother   . Heart disease Sister     Social history:  reports that she has quit smoking. She started smoking about 38 years ago. She has never used smokeless tobacco. She reports that she does not drink alcohol or use drugs.   No Known Allergies  Medications:  Prior to Admission medications   Medication Sig Start Date End Date Taking? Authorizing Provider  alendronate (FOSAMAX) 70 MG tablet Take 70 mg by mouth once a week. Take with a full glass of water on an empty stomach. On Sunday   Yes Historical Provider, MD  aspirin EC 81 MG tablet Take 81 mg by mouth daily.   Yes Historical Provider, MD  atorvastatin (LIPITOR) 40 MG tablet Take 40 mg by mouth daily.   Yes Historical Provider, MD  calcium-vitamin D (OSCAL WITH D) 500-200 MG-UNIT tablet Take 1 tablet by mouth daily with breakfast.   Yes Historical Provider, MD  escitalopram (LEXAPRO) 10 MG tablet Take 10 mg by mouth at bedtime.   Yes Historical Provider, MD  levothyroxine (SYNTHROID, LEVOTHROID) 125 MCG tablet Take 112 mcg by mouth daily before breakfast.    Yes Historical Provider, MD  mirabegron ER (MYRBETRIQ) 25 MG TB24 tablet Take 25 mg by mouth daily.   Yes Historical Provider, MD  Multiple Vitamin (MULTIVITAMIN) tablet Take 1 tablet by mouth daily.   Yes Historical Provider, MD  oseltamivir (TAMIFLU) 75 MG capsule Take 75 mg by mouth daily.   Yes Historical Provider, MD  acetaminophen (TYLENOL) 650 MG CR tablet Take 650 mg by mouth every 8 (eight) hours as needed for pain.    Historical Provider, MD  Dextromethorphan-Guaifenesin (ROBITUSSIN DM) 10-100 MG/5ML liquid Take 5 mLs by mouth every 12 (twelve) hours.    Historical Provider, MD  traMADol (ULTRAM) 50 MG tablet Take 50 mg by mouth every 6 (six) hours as needed.    Historical Provider, MD  zaleplon (SONATA) 5 MG capsule Take 1 capsule (5  mg total) by mouth at bedtime as needed for sleep. 01/29/17   Kathrynn Ducking, MD    ROS:  Out of a complete 14 system review of symptoms, the patient complains only of the following symptoms, and all other reviewed systems are negative.  Decreased activity, fatigue Double vision Excessive thirst Walking problems  Blood pressure 130/80, pulse 70, height 5\' 9"  (1.753 m), weight 198 lb 8 oz (90 kg), SpO2 98 %.  Physical Exam  General: The patient is alert and cooperative at the time of the examination.  Skin: No significant peripheral edema is noted.   Neurologic Exam  Mental status: The patient is alert and oriented x 3 at the time of the examination. The patient has apparent normal recent and remote memory, with an apparently normal attention span and concentration ability. Mini-Mental Status Examination done today shows a total score 27/30.   Cranial nerves: Facial symmetry is present. Speech is slow, slightly dysarthric. Extraocular movements are full. Visual fields are full. Masking of the face is seen.  Motor: The patient has good strength in all 4 extremities.  Sensory examination: Soft touch sensation is symmetric on the face, arms, and legs.  Coordination: The patient has good finger-nose-finger and heel-to-shin bilaterally.  Gait and station: The patient has a slow gait, the patient uses a walker she has slowness with turns. Tandem gait was not attempted. Romberg is negative No drift is seen.  Reflexes: Deep tendon reflexes are symmetric, but they are somewhat brisk in the legs.   Assessment/Plan:  1. Progressive supranuclear palsy  2. Gait disorder  3. Pseudobulbar affect  The patient is having some issues with sleep, we will add low-dose Sonata 5 mg in the middle the night if she awakens. The patient will continue her speech therapy, she will follow-up in about 6 months. Unfortunately, there is no effective therapy for progressive supranuclear palsy.  Jill Alexanders MD 01/29/2017 12:58 PM  [facility] [address] [place], [place] [address]  Phone [phone] Fax [phone]

## 2017-05-09 ENCOUNTER — Telehealth: Payer: Self-pay | Admitting: Neurology

## 2017-05-09 NOTE — Telephone Encounter (Signed)
I called the sister. The patient has abscessed tooth, will be seeing an oral surgeon, she has had difficulty opening her mouth, may require general anesthesia to do the procedure.  I do not see that her neurologic condition will exclude her from the procedure.

## 2017-05-09 NOTE — Telephone Encounter (Signed)
Patient's sister is calling. The patient has appointment with oral surgeon today regarding having a tooth pulled. Can she be put to sleep to have this done with her condition? Please call and discuss.

## 2017-05-28 ENCOUNTER — Telehealth: Payer: Self-pay | Admitting: Neurology

## 2017-05-28 ENCOUNTER — Ambulatory Visit: Payer: Medicare Other | Admitting: Neurology

## 2017-05-28 DIAGNOSIS — G231 Progressive supranuclear ophthalmoplegia [Steele-Richardson-Olszewski]: Secondary | ICD-10-CM

## 2017-05-28 NOTE — Telephone Encounter (Signed)
Pt sister Margarita Rana [phone]) said the physical therapist at the nursing home that the pt is at wanted Dr Jannifer Franklin to be asked about Botox for PSP patients. Mariann Laster said her sister is only able to partially able to open her mouth and wanted to know if pt would be a good canidate for Botox, please call Pt sister Mariann Laster back to discuss

## 2017-05-28 NOTE — Telephone Encounter (Signed)
I tried to call the sister, unable to leave a message.  The patient could potentially be a candidate for Botox, this likely will require an ENT evaluation, need to determine that there are no other mechanical issues with the temporomandibular joints that would prevent full jaw opening.

## 2017-05-29 NOTE — Telephone Encounter (Signed)
I called and talked with the sister. The patient is unable to open the mouth well enough to effectively eat regular food, the food has to be crushed.  I will set up a referral to ENT to determine whether or not there is a physical restrictive problem with the TMJ or if this is a problem with muscle tone that is restricting jaw opening. If so, Botox could be utilized to help treat this. We do not perform this type of treatment in this office.  I will refer the patient to Dr. Redmond Baseman.

## 2017-05-29 NOTE — Addendum Note (Signed)
Addended by: Kathrynn Ducking on: 05/29/2017 07:34 AM   Modules accepted: Orders

## 2017-08-01 ENCOUNTER — Ambulatory Visit: Payer: Medicare Other | Admitting: Neurology

## 2017-08-08 NOTE — Telephone Encounter (Signed)
I called patient. I left a message, I will try to Call back tomorrow.

## 2017-08-08 NOTE — Telephone Encounter (Signed)
Pt sister(on DPR) is asking for a referral for Botox @ [facility], their fax# is [phone] ,please call sister Pleas Patricia

## 2017-08-09 NOTE — Telephone Encounter (Signed)
I called the patient's sister. The patient was seen by Dr. Redmond Baseman. He tried to get Botox set up, but they have not heard anything.  The patient is having increasing difficulty with chewing and swallowing, she is losing weight. I will make the referral to [facility].

## 2017-08-09 NOTE — Addendum Note (Signed)
Addended by: Kathrynn Ducking on: 08/09/2017 08:34 AM   Modules accepted: Orders

## 2017-09-10 ENCOUNTER — Telehealth: Payer: Self-pay | Admitting: *Deleted

## 2017-09-10 ENCOUNTER — Encounter: Payer: Self-pay | Admitting: Neurology

## 2017-09-10 ENCOUNTER — Ambulatory Visit (INDEPENDENT_AMBULATORY_CARE_PROVIDER_SITE_OTHER): Payer: Medicare Other | Admitting: Neurology

## 2017-09-10 VITALS — BP 119/87 | HR 87 | Ht 69.0 in

## 2017-09-10 DIAGNOSIS — F482 Pseudobulbar affect: Secondary | ICD-10-CM | POA: Diagnosis not present

## 2017-09-10 DIAGNOSIS — G231 Progressive supranuclear ophthalmoplegia [Steele-Richardson-Olszewski]: Secondary | ICD-10-CM | POA: Diagnosis not present

## 2017-09-10 DIAGNOSIS — R269 Unspecified abnormalities of gait and mobility: Secondary | ICD-10-CM | POA: Diagnosis not present

## 2017-09-10 NOTE — Progress Notes (Signed)
Reason for visit: Progressive supranuclear palsy  Paige Murphy is an 71 y.o. female  History of present illness:  Paige Murphy is a 71 year old right-handed white female with a history of progressive supranuclear palsy. This is associated with significant dysarthria, dysphagia, and difficulty with jaw opening. The patient has lost around the 100 pounds over the last one year according to the family. She has been seen by Dr. Hart Robinsons at [facility], he has referred her to Dr. Truman Hayward for Botox injections in the jaw. X-rays of the temporomandibular joints have been unremarkable. The patient is ambulatory with assistance, she still will try to go to the bathroom on her own and she may fall. The patient is on a soft mechanical diet, she has thickened liquids. She still does cough some with swallowing. She returns to this office for an evaluation.  Past Medical History:  Diagnosis Date  . Abnormality of gait 04/13/2014  . Anxiety   . Breast cancer [facility])    s/p radiation  . Chronic fatigue 05/15/2016  . Depression   . Depression   . DJD (degenerative joint disease)   . Dyslipidemia   . Hypothyroid   . Meniere's disease   . Obese   . Osteopenia   . Osteopenia   . Parkinsonism ([place]) 12/24/2014  . 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 PSP (progressive supranuclear palsy) ([place]) 10/27/2015  . Sleep apnea   . TIA (transient ischemic attack)   . Wrist pain 2014    Past Surgical History:  Procedure Laterality Date  . APPENDECTOMY    . LAPAROSCOPIC VAGINAL HYSTERECTOMY  1984  . MASTECTOMY  2010   breast cancer, left  . NECK SURGERY  1999   cervical procedure  . OOPHORECTOMY  1984    Family History  Problem Relation Age of Onset  . Dementia Mother   . COPD Mother   . Parkinsonism Mother   . Stroke Mother   . Heart disease Father   . Heart disease Brother   . Heart disease Sister     Social history:  reports that she has quit smoking. She started smoking about 38 years ago. She  has never used smokeless tobacco. She reports that she does not drink alcohol or use drugs.   No Known Allergies  Medications:  Prior to Admission medications   Medication Sig Start Date End Date Taking? Authorizing Provider  acetaminophen (TYLENOL) 650 MG CR tablet Take 650 mg by mouth every 8 (eight) hours as needed for pain.    [provider]  alendronate (FOSAMAX) 70 MG tablet Take 70 mg by mouth once a week. Take with a full glass of water on an empty stomach. On Sunday    [provider]  aspirin EC 81 MG tablet Take 81 mg by mouth daily.    [provider]  atorvastatin (LIPITOR) 40 MG tablet Take 40 mg by mouth daily.    [provider]  calcium-vitamin D (OSCAL WITH D) 500-200 MG-UNIT tablet Take 1 tablet by mouth daily with breakfast.    [provider]  Dextromethorphan-Guaifenesin (ROBITUSSIN DM) 10-100 MG/5ML liquid Take 5 mLs by mouth every 12 (twelve) hours.    [provider]  escitalopram (LEXAPRO) 10 MG tablet Take 10 mg by mouth at bedtime.    [provider]  levothyroxine (SYNTHROID, LEVOTHROID) 125 MCG tablet Take 112 mcg by mouth daily before breakfast.     [provider]  mirabegron ER (MYRBETRIQ) 25 MG TB24 tablet Take 25  mg by mouth daily.    [provider]  Multiple Vitamin (MULTIVITAMIN) tablet Take 1 tablet by mouth daily.    [provider]  oseltamivir (TAMIFLU) 75 MG capsule Take 75 mg by mouth daily.    [provider]  traMADol (ULTRAM) 50 MG tablet Take 50 mg by mouth every 6 (six) hours as needed.    [provider]  zaleplon (SONATA) 5 MG capsule Take 1 capsule (5 mg total) by mouth at bedtime as needed for sleep. 01/29/17   Kathrynn Ducking, MD    ROS:  Out of a complete 14 system review of symptoms, the patient complains only of the following symptoms, and all other reviewed systems are negative.  Decreased activity, weight loss Hearing loss,  difficulty swallowing, drooling Loss of vision, blurred vision Cough, shortness of breath, choking Snoring Incontinence of the bladder, frequency of urination, urinary urgency Joint pain, back pain, walking difficulty, neck pain, neck stiffness Memory loss, speech difficulty, weakness Depression  Blood pressure 119/87, pulse 87, height 5\' 9"  (1.753 m), SpO2 92 %.  Physical Exam  General: The patient is alert and cooperative at the time of the examination.  Skin: No significant peripheral edema is noted.   Neurologic Exam  Mental status: The patient is alert and oriented x 3 at the time of the examination. The patient has apparent normal recent and remote memory, with an apparently normal attention span and concentration ability.   Cranial nerves: Facial symmetry is present. Speech is dysarthric, not aphasic. Extraocular movements are full in the horizontal plane, but are significant restricted with looking up and looking down. Visual fields are full. The patient is only able to open up the mouth 1 inch or so. She can sig out the tongue, and move the tongue side-to-side very slowly.  Motor: The patient has good strength in all 4 extremities.  Sensory examination: Soft touch sensation is symmetric on the face, arms, and legs.  Coordination: The patient has good finger-nose-finger and heel-to-shin bilaterally.  Gait and station: The patient is able to stand with assistance, she can walk with assistance but the gait is unstable.  Reflexes: Deep tendon reflexes are symmetric, but are brisk throughout.   Assessment/Plan:  1. Progressive supranuclear palsy  2. Gait disorder  3. Pseudobulbar affect  The patient hopefully will be up to get Botox injections into the jaw muscles to help improve her ability to open her mouth, this will be done by Dr. Truman Hayward at [facility]. The patient is losing weight. The patient will need assistance anytime she tries to get up and ambulate. She will follow-up  in about 6 months.   Jill Alexanders MD 09/10/2017 4:01 PM  [facility] [address] [place], [place] [address]  Phone [phone] Fax [phone]

## 2017-09-10 NOTE — Telephone Encounter (Signed)
Received med list via fax as requested. Gave to CW,MD for review and updated pt med list in EPIC.

## 2017-09-10 NOTE — Telephone Encounter (Signed)
[facility] at [phone] and spoke with Inez Catalina. She transferred me to [place]. Advised we do not have a medication list with pt paperwork. Requested list be faxed to [phone]. She verbalized understanding and will fax med list to [place]

## 2018-02-05 ENCOUNTER — Telehealth: Payer: Self-pay | Admitting: Neurology

## 2018-02-05 NOTE — Telephone Encounter (Signed)
I called Dr. Ronne Binning.  The patient is having increasing problems with opening her mouth, she is not getting adequate nutrition and, they are considering a feeding tube.  I have no problem with this.  Dr. Ronne Binning was asking whether or not when she was sedated with her her jaw would be able to be opened wider for the procedure.  I would be concerned that the patient may have developed a "frozen joint" as it has been sometime since she has been able to get full excursion on jaw opening.  This may be in part why Botox failed for her.  He will discussed the issue with anesthesiology.

## 2018-02-05 NOTE — Telephone Encounter (Signed)
Dr. Zollie Beckers at the [facility] in [place] requesting a call back from Dr. Jannifer Franklin to discuss the pts condition, please contact at [phone]

## 2018-02-23 ENCOUNTER — Inpatient Hospital Stay (HOSPITAL_COMMUNITY)
Admission: AD | Admit: 2018-02-23 | Discharge: 2018-03-01 | DRG: 871 | Disposition: A | Discharge status: Skilled Nursing Facility | Payer: Medicare Other | Source: Other Acute Inpatient Hospital | Attending: Internal Medicine | Admitting: Internal Medicine

## 2018-02-23 DIAGNOSIS — E876 Hypokalemia: Secondary | ICD-10-CM | POA: Diagnosis not present

## 2018-02-23 DIAGNOSIS — Z993 Dependence on wheelchair: Secondary | ICD-10-CM

## 2018-02-23 DIAGNOSIS — G20 Parkinson's disease: Secondary | ICD-10-CM | POA: Diagnosis not present

## 2018-02-23 DIAGNOSIS — R579 Shock, unspecified: Secondary | ICD-10-CM

## 2018-02-23 DIAGNOSIS — Z880 Allergy status to penicillin: Secondary | ICD-10-CM

## 2018-02-23 DIAGNOSIS — Z9181 History of falling: Secondary | ICD-10-CM

## 2018-02-23 DIAGNOSIS — H8109 Meniere's disease, unspecified ear: Secondary | ICD-10-CM | POA: Diagnosis present

## 2018-02-23 DIAGNOSIS — R197 Diarrhea, unspecified: Secondary | ICD-10-CM | POA: Diagnosis present

## 2018-02-23 DIAGNOSIS — R5382 Chronic fatigue, unspecified: Secondary | ICD-10-CM | POA: Diagnosis present

## 2018-02-23 DIAGNOSIS — Z923 Personal history of irradiation: Secondary | ICD-10-CM

## 2018-02-23 DIAGNOSIS — F329 Major depressive disorder, single episode, unspecified: Secondary | ICD-10-CM | POA: Diagnosis present

## 2018-02-23 DIAGNOSIS — Z853 Personal history of malignant neoplasm of breast: Secondary | ICD-10-CM

## 2018-02-23 DIAGNOSIS — Z87891 Personal history of nicotine dependence: Secondary | ICD-10-CM

## 2018-02-23 DIAGNOSIS — Z960 Presence of urogenital implants: Secondary | ICD-10-CM

## 2018-02-23 DIAGNOSIS — Z79890 Hormone replacement therapy (postmenopausal): Secondary | ICD-10-CM

## 2018-02-23 DIAGNOSIS — E872 Acidosis: Secondary | ICD-10-CM | POA: Diagnosis present

## 2018-02-23 DIAGNOSIS — M8580 Other specified disorders of bone density and structure, unspecified site: Secondary | ICD-10-CM | POA: Diagnosis present

## 2018-02-23 DIAGNOSIS — Z931 Gastrostomy status: Secondary | ICD-10-CM

## 2018-02-23 DIAGNOSIS — I480 Paroxysmal atrial fibrillation: Secondary | ICD-10-CM | POA: Diagnosis present

## 2018-02-23 DIAGNOSIS — E785 Hyperlipidemia, unspecified: Secondary | ICD-10-CM | POA: Diagnosis not present

## 2018-02-23 DIAGNOSIS — R21 Rash and other nonspecific skin eruption: Secondary | ICD-10-CM | POA: Diagnosis not present

## 2018-02-23 DIAGNOSIS — Z6826 Body mass index (BMI) 26.0-26.9, adult: Secondary | ICD-10-CM

## 2018-02-23 DIAGNOSIS — Z9010 Acquired absence of unspecified breast and nipple: Secondary | ICD-10-CM

## 2018-02-23 DIAGNOSIS — I10 Essential (primary) hypertension: Secondary | ICD-10-CM | POA: Diagnosis not present

## 2018-02-23 DIAGNOSIS — T886XXA Anaphylactic reaction due to adverse effect of correct drug or medicament properly administered, initial encounter: Secondary | ICD-10-CM | POA: Diagnosis present

## 2018-02-23 DIAGNOSIS — Z90710 Acquired absence of both cervix and uterus: Secondary | ICD-10-CM

## 2018-02-23 DIAGNOSIS — X58XXXA Exposure to other specified factors, initial encounter: Secondary | ICD-10-CM | POA: Diagnosis not present

## 2018-02-23 DIAGNOSIS — A35 Other tetanus: Secondary | ICD-10-CM | POA: Diagnosis present

## 2018-02-23 DIAGNOSIS — R1310 Dysphagia, unspecified: Secondary | ICD-10-CM | POA: Diagnosis present

## 2018-02-23 DIAGNOSIS — G9341 Metabolic encephalopathy: Secondary | ICD-10-CM | POA: Diagnosis present

## 2018-02-23 DIAGNOSIS — Z9221 Personal history of antineoplastic chemotherapy: Secondary | ICD-10-CM

## 2018-02-23 DIAGNOSIS — T782XXS Anaphylactic shock, unspecified, sequela: Secondary | ICD-10-CM | POA: Diagnosis not present

## 2018-02-23 DIAGNOSIS — E039 Hypothyroidism, unspecified: Secondary | ICD-10-CM | POA: Diagnosis present

## 2018-02-23 DIAGNOSIS — L8990 Pressure ulcer of unspecified site, unspecified stage: Secondary | ICD-10-CM

## 2018-02-23 DIAGNOSIS — Z7401 Bed confinement status: Secondary | ICD-10-CM

## 2018-02-23 DIAGNOSIS — T360X5A Adverse effect of penicillins, initial encounter: Secondary | ICD-10-CM | POA: Diagnosis present

## 2018-02-23 DIAGNOSIS — Z8673 Personal history of transient ischemic attack (TIA), and cerebral infarction without residual deficits: Secondary | ICD-10-CM

## 2018-02-23 DIAGNOSIS — I361 Nonrheumatic tricuspid (valve) insufficiency: Secondary | ICD-10-CM | POA: Diagnosis not present

## 2018-02-23 DIAGNOSIS — I4891 Unspecified atrial fibrillation: Secondary | ICD-10-CM | POA: Diagnosis not present

## 2018-02-23 DIAGNOSIS — J690 Pneumonitis due to inhalation of food and vomit: Secondary | ICD-10-CM | POA: Diagnosis not present

## 2018-02-23 DIAGNOSIS — F482 Pseudobulbar affect: Secondary | ICD-10-CM | POA: Diagnosis present

## 2018-02-23 DIAGNOSIS — E43 Unspecified severe protein-calorie malnutrition: Secondary | ICD-10-CM | POA: Diagnosis not present

## 2018-02-23 DIAGNOSIS — J9601 Acute respiratory failure with hypoxia: Secondary | ICD-10-CM | POA: Diagnosis not present

## 2018-02-23 DIAGNOSIS — L0390 Cellulitis, unspecified: Secondary | ICD-10-CM | POA: Diagnosis present

## 2018-02-23 DIAGNOSIS — T782XXA Anaphylactic shock, unspecified, initial encounter: Secondary | ICD-10-CM

## 2018-02-23 DIAGNOSIS — F419 Anxiety disorder, unspecified: Secondary | ICD-10-CM | POA: Diagnosis present

## 2018-02-23 DIAGNOSIS — G231 Progressive supranuclear ophthalmoplegia [Steele-Richardson-Olszewski]: Secondary | ICD-10-CM | POA: Diagnosis not present

## 2018-02-23 DIAGNOSIS — Z7982 Long term (current) use of aspirin: Secondary | ICD-10-CM

## 2018-02-23 DIAGNOSIS — Z66 Do not resuscitate: Secondary | ICD-10-CM | POA: Diagnosis not present

## 2018-02-23 DIAGNOSIS — Z9049 Acquired absence of other specified parts of digestive tract: Secondary | ICD-10-CM

## 2018-02-23 DIAGNOSIS — Z978 Presence of other specified devices: Secondary | ICD-10-CM

## 2018-02-23 DIAGNOSIS — G4730 Sleep apnea, unspecified: Secondary | ICD-10-CM | POA: Diagnosis present

## 2018-02-23 DIAGNOSIS — Z79899 Other long term (current) drug therapy: Secondary | ICD-10-CM

## 2018-02-23 DIAGNOSIS — A419 Sepsis, unspecified organism: Secondary | ICD-10-CM | POA: Diagnosis not present

## 2018-02-23 LAB — URINALYSIS, ROUTINE W REFLEX MICROSCOPIC
Bilirubin Urine: NEGATIVE
GLUCOSE, UA: 50 mg/dL — AB
Ketones, ur: NEGATIVE mg/dL
LEUKOCYTES UA: NEGATIVE
NITRITE: NEGATIVE
Protein, ur: 30 mg/dL — AB
SPECIFIC GRAVITY, URINE: 1.03 (ref 1.005–1.030)
Squamous Epithelial / LPF: NONE SEEN
pH: 5 (ref 5.0–8.0)

## 2018-02-23 LAB — CBC WITH DIFFERENTIAL/PLATELET
Basophils Absolute: 0 10*3/uL (ref 0.0–0.1)
Basophils Relative: 0 %
Eosinophils Absolute: 0 10*3/uL (ref 0.0–0.7)
Eosinophils Relative: 0 %
HEMATOCRIT: 38.8 % (ref 36.0–46.0)
HEMOGLOBIN: 12.9 g/dL (ref 12.0–15.0)
LYMPHS ABS: 0.3 10*3/uL — AB (ref 0.7–4.0)
Lymphocytes Relative: 2 %
MCH: 31.5 pg (ref 26.0–34.0)
MCHC: 33.2 g/dL (ref 30.0–36.0)
MCV: 94.6 fL (ref 78.0–100.0)
MONOS PCT: 3 %
Monocytes Absolute: 0.5 10*3/uL (ref 0.1–1.0)
NEUTROS ABS: 15.6 10*3/uL — AB (ref 1.7–7.7)
NEUTROS PCT: 95 %
Platelets: 188 10*3/uL (ref 150–400)
RBC: 4.1 MIL/uL (ref 3.87–5.11)
RDW: 12.9 % (ref 11.5–15.5)
WBC: 16.4 10*3/uL — ABNORMAL HIGH (ref 4.0–10.5)

## 2018-02-23 LAB — BASIC METABOLIC PANEL
ANION GAP: 13 (ref 5–15)
BUN: 40 mg/dL — ABNORMAL HIGH (ref 6–20)
CHLORIDE: 109 mmol/L (ref 101–111)
CO2: 18 mmol/L — AB (ref 22–32)
Calcium: 7.4 mg/dL — ABNORMAL LOW (ref 8.9–10.3)
Creatinine, Ser: 0.71 mg/dL (ref 0.44–1.00)
GFR calc non Af Amer: >60 mL/min (ref >60)
Glucose, Bld: 203 mg/dL — ABNORMAL HIGH (ref 65–99)
POTASSIUM: 3.2 mmol/L — AB (ref 3.5–5.1)
Sodium: 140 mmol/L (ref 135–145)

## 2018-02-23 LAB — T4, FREE: Free T4: 0.89 ng/dL (ref 0.61–1.12)

## 2018-02-23 LAB — MRSA PCR SCREENING: MRSA BY PCR: NEGATIVE

## 2018-02-23 LAB — LACTIC ACID, PLASMA: LACTIC ACID, VENOUS: 3.7 mmol/L — AB (ref 0.5–1.9)

## 2018-02-23 LAB — GLUCOSE, CAPILLARY: Glucose-Capillary: 175 mg/dL — ABNORMAL HIGH (ref 65–99)

## 2018-02-23 LAB — PHOSPHORUS: PHOSPHORUS: 1.9 mg/dL — AB (ref 2.5–4.6)

## 2018-02-23 LAB — MAGNESIUM: Magnesium: 1.7 mg/dL (ref 1.7–2.4)

## 2018-02-23 LAB — TROPONIN I: Troponin I: 0.04 ng/mL (ref <0.03)

## 2018-02-23 LAB — PROCALCITONIN: Procalcitonin: 6.93 ng/mL

## 2018-02-23 LAB — TSH: TSH: 2.334 u[IU]/mL (ref 0.350–4.500)

## 2018-02-23 MED ORDER — AMIODARONE LOAD VIA INFUSION
150.0000 mg | Freq: Once | INTRAVENOUS | Status: AC
  Administered 2018-02-23: 150 mg via INTRAVENOUS
  Filled 2018-02-23: qty 83.34

## 2018-02-23 MED ORDER — LACTATED RINGERS IV SOLN
INTRAVENOUS | Status: DC
  Administered 2018-02-23: 22:00:00 via INTRAVENOUS
  Administered 2018-02-24: 100 mL/h via INTRAVENOUS
  Administered 2018-02-24: 11:00:00 via INTRAVENOUS
  Administered 2018-02-25: 100 mL/h via INTRAVENOUS

## 2018-02-23 MED ORDER — SODIUM CHLORIDE 0.9 % IV SOLN
250.0000 mL | INTRAVENOUS | Status: DC | PRN
  Administered 2018-02-24: 250 mL via INTRAVENOUS

## 2018-02-23 MED ORDER — PHENYLEPHRINE HCL 10 MG/ML IJ SOLN
0.0000 ug/min | INTRAMUSCULAR | Status: DC
  Administered 2018-02-23: 20 ug/min via INTRAVENOUS
  Filled 2018-02-23: qty 1
  Filled 2018-02-23: qty 10

## 2018-02-23 MED ORDER — SODIUM CHLORIDE 0.9 % IV BOLUS
500.0000 mL | Freq: Once | INTRAVENOUS | Status: AC
  Administered 2018-02-23: 500 mL via INTRAVENOUS

## 2018-02-23 MED ORDER — AMIODARONE HCL IN DEXTROSE 360-4.14 MG/200ML-% IV SOLN
30.0000 mg/h | INTRAVENOUS | Status: DC
  Administered 2018-02-24 – 2018-02-25 (×4): 30 mg/h via INTRAVENOUS
  Filled 2018-02-23 (×4): qty 200

## 2018-02-23 MED ORDER — SODIUM CHLORIDE 0.9 % IV SOLN
0.0000 ug/min | INTRAVENOUS | Status: DC
  Administered 2018-02-24: 130 ug/min via INTRAVENOUS
  Filled 2018-02-23: qty 20

## 2018-02-23 MED ORDER — AMIODARONE HCL IN DEXTROSE 360-4.14 MG/200ML-% IV SOLN
60.0000 mg/h | INTRAVENOUS | Status: DC
  Administered 2018-02-23 – 2018-02-24 (×2): 60 mg/h via INTRAVENOUS
  Filled 2018-02-23 (×2): qty 200

## 2018-02-23 NOTE — Progress Notes (Signed)
eLink Physician-Brief Progress Note Patient Name: Paige Murphy DOB: Jan 14, 1946 MRN: [id number]   Date of Service  02/23/2018  HPI/Events of Note  72 yo female arrives form [facility] with AFIB and RVR and BP = 79/56 with MAP = 63 and HR = 120 (AFIB). Minimal records in Epic and can't access any paper chart that might have come from [facility].   eICU Interventions  Will order: 1. Bolus with 0.9 NaCl 500 mL IV over 30 minutes now. 2. Phenylephrine IV infusion. Titrate to MAP > 65.  3. CBC, BMP and Mg++ level now.  4. Ground team notified of admission.      Intervention Category Evaluation Type: New Patient Evaluation  Lysle Dingwall 02/23/2018, 9:06 PM

## 2018-02-23 NOTE — H&P (Addendum)
PULMONARY / CRITICAL CARE MEDICINE   Name: Paige Murphy MRN: [id number] DOB: 08/18/1946    ADMISSION DATE:  02/23/2018 CONSULTATION DATE:  02/23/2018  REFERRING MD:  Dr. Westly Pam  CHIEF COMPLAINT:  Sepsis   HISTORY OF PRESENT ILLNESS:   72 year old female with Progressive Supranuclear Palsy with Lock Jaw/Dyshagia and severe malnutrition s/p PEG, Depression/Anxiety, Hypothyroidism  Presents to ED from OSH on 3/30 from [place] presents to with progressive AMS. At baseline patient is non-verbal however alert and can respond with head movements, and is wheelchair/bed bound. Upon arrival to ED patient hypotensive with A.Fib RVR. Attempted cardioversion x 3 without success and started on Cardizem gtt. Concern for continued aspiration, recent PEG placement 3/30. CXR clear. Given Zosyn/Vancomcyin. Patient has known anaphylactic reaction to Zosyn, patient developed a rash, was given Benadryl, Solu-medrol, and EPI IM. Transported to [facility] for further management.   On arrival patient is hypotensive with systolic 43-32, given fluid and started on NEO gtt.    PAST MEDICAL HISTORY :  She  has a past medical history of Abnormality of gait (04/13/2014), Anxiety, Breast cancer ([place]), Chronic fatigue (05/15/2016), Depression, Depression, DJD (degenerative joint disease), Dyslipidemia, Hypothyroid, Meniere's disease, Obese, Osteopenia, Osteopenia, Parkinsonism ([place]) (12/24/2014), Pseudobulbar affect (01/29/2017), PSP (progressive supranuclear palsy) ([place]) (10/27/2015), Sleep apnea, TIA (transient ischemic attack), and Wrist pain (2014).  PAST SURGICAL HISTORY: She  has a past surgical history that includes Laparoscopic vaginal hysterectomy (1984); Mastectomy (2010); Neck surgery (1999); Oophorectomy (1984); and Appendectomy.  Allergies  Allergen Reactions  . Zosyn [Piperacillin Sod-Tazobactam So] Anaphylaxis    No current facility-administered medications on file prior to encounter.    Current Outpatient  Medications on File Prior to Encounter  Medication Sig  . acetaminophen (TYLENOL) 650 MG CR tablet Take 650 mg by mouth every 8 (eight) hours as needed for pain.  Marland Kitchen aspirin EC 81 MG tablet Take 81 mg by mouth daily.  . ATROPINE SULFATE PO Take 4 drops by mouth 2 (two) times daily.  Marland Kitchen Dextromethorphan-Guaifenesin (ROBITUSSIN DM) 10-100 MG/5ML liquid Take 5 mLs by mouth every 12 (twelve) hours.  . docusate sodium (COLACE) 100 MG capsule Take 100 mg by mouth every evening.  . escitalopram (LEXAPRO) 10 MG tablet Take 10 mg by mouth at bedtime.  Marland Kitchen HYDROcodone-acetaminophen (NORCO/VICODIN) 5-325 MG tablet Take 1 tablet by mouth every 6 (six) hours as needed for moderate pain.  Marland Kitchen levothyroxine (SYNTHROID, LEVOTHROID) 125 MCG tablet Take 112 mcg by mouth daily before breakfast.   . magnesium hydroxide (MILK OF MAGNESIA) 400 MG/5ML suspension Take 30 mLs by mouth daily as needed for mild constipation.  Marland Kitchen oxybutynin (DITROPAN) 5 MG tablet Take 5 mg by mouth 2 (two) times daily.  . Polyvinyl Alcohol (LIQUITEARS OP) Place 1 drop into both eyes every 2 (two) hours as needed.    FAMILY HISTORY:  Her indicated that her mother is alive. She indicated that her father is deceased. She indicated that all of her three sisters are alive. She indicated that only one of her two brothers is alive.   SOCIAL HISTORY: She  reports that she has quit smoking. She started smoking about 39 years ago. She has never used smokeless tobacco. She reports that she does not drink alcohol or use drugs.  REVIEW OF SYSTEMS:   Unable to review as patient is non-verbal and lethargic   SUBJECTIVE:   VITAL SIGNS: BP (!) 73/56   Temp 99.4 F (37.4 C) (Axillary)   Resp 17   Wt 74.2  kg (163 lb 9.3 oz)   SpO2 98%   BMI 24.16 kg/m   HEMODYNAMICS:    VENTILATOR SETTINGS:    INTAKE / OUTPUT: No intake/output data recorded.  PHYSICAL EXAMINATION: General: Chronically ill adult female  Neuro:  Lethargic, Opens eyes to  physical stimulation, pupils intact  HEENT:  Dry MM  Cardiovascular:  Irregular, Rate Controlled  Lungs:  Clear breath sounds  Abdomen:  PEG in place, non-distended  Musculoskeletal:  -edema  Skin:  Warm, dry, intact   LABS:  BMET No results for input(s): NA, K, CL, CO2, BUN, CREATININE, GLUCOSE in the last 168 hours.  Electrolytes No results for input(s): CALCIUM, MG, PHOS in the last 168 hours.  CBC No results for input(s): WBC, HGB, HCT, PLT in the last 168 hours.  Coag's No results for input(s): APTT, INR in the last 168 hours.  Sepsis Markers No results for input(s): LATICACIDVEN, PROCALCITON, O2SATVEN in the last 168 hours.  ABG No results for input(s): PHART, PCO2ART, PO2ART in the last 168 hours.  Liver Enzymes No results for input(s): AST, ALT, ALKPHOS, BILITOT, ALBUMIN in the last 168 hours.  Cardiac Enzymes No results for input(s): TROPONINI, PROBNP in the last 168 hours.  Glucose No results for input(s): GLUCAP in the last 168 hours.  Imaging No results found.   STUDIES:  CXR 3/31 >>  ECHO 3/31 >>   CULTURES: Blood 3/30 >>  U/A 3/30 > Negative  ANTIBIOTICS: Zosyn 3/30  Vancomycin 3/30 >> Cefepime 3/31 >>   SIGNIFICANT EVENTS: 3/30 > Presents to ED   LINES/TUBES: PIV   DISCUSSION: 72 year old female whom resides in [place] presents to OSH ED with progressive AMS. At baseline patient is non-verbal however alert and can respond with head movements, and is wheelchair/bed bound. Upon arrival to ED patient hypotensive with A.Fib RVR. Attempted cardioversion x 3 without success and started on Cardizem gtt. Concern for continued aspiration, recent PEG placement 3/30. CXR clear. Given Zosyn/Vancomcyin. Patient has known anaphylactic reaction to Zosyn, patient developed a rash, was given Benadryl, Solu-medrol, and EPI IM. Transported to [facility] for further management.   ASSESSMENT / PLAN:  PULMONARY A: At Risk for Respiratory Decompensation  secondary to Encephalopathy and inability to protect airway  H/O OSA/OHS  P:   Maintain Oxygen Saturation >92 Monitor > Currently protecting airway  Pulmonary Hygiene   CARDIOVASCULAR A:  A.Fib RVR s/p failed cardioversion x 3 at OSH  Allergic Reaction secondary to zosyn administration >Given Solu-medrol, Benadryl, and EPI IM at OSH  H/O Dyslipidemia  P:  Cardiac Monitoring  Wean Neo to Maintain MAP > 65 (Cap off at 200 mcg if requirements increase will place CVC)  Heparin gtt  Continue Amiodarone    RENAL A:   Non-Gap Metabolic Acidosis with Lactic Acidosis  LA 4.7 > 3.7 P:   Trend BMP  Replace electrolytes as indicated  LR @ 100 ml/hr   GASTROINTESTINAL A:   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 placement on 3/22  P:   NPO  PPI   HEMATOLOGIC A:   VTE Prophylaxis/A.fib   P:  Trend CBC  Maintain Hbg >7 Heparin gtt    INFECTIOUS A:   Sepsis in setting of presumed chronic aspiration Recent PEG placement > clean site  Leukocytosis  WBC 16.4  PCT 6.93  P:   Trend WBC and Fever Curve  Trend LA and PCT  PAN Culture  Cefepime/Vanocmyin   ENDOCRINE A:   Hypothyroidism  P:   Continue Synthroid  Trend Glucose  Cortisol pending   NEUROLOGIC A:   Metabolic Encephalopathy  H/O Supranuclear Palsy with disease progression, history of multiple falls > mainly uses wheelchair for mobility   -Followed BY Dr. Jannifer Franklin with [facility]  P:   Monitor  Hold all sedating medications    FAMILY  - Updates: Family updated at bedside. Next of [place] Adult Son, however sister has been very involved in care. Family are discussing code status and will update [place]   - Inter-disciplinary family meet or Palliative Care meeting due by: 03/02/2018    Hayden Pedro, AGACNP-BC [place]  Pgr: [phone]  PCCM Pgr: [phone]

## 2018-02-24 ENCOUNTER — Inpatient Hospital Stay (HOSPITAL_COMMUNITY): Payer: Medicare Other

## 2018-02-24 ENCOUNTER — Other Ambulatory Visit: Payer: Self-pay

## 2018-02-24 DIAGNOSIS — R579 Shock, unspecified: Secondary | ICD-10-CM

## 2018-02-24 DIAGNOSIS — J9601 Acute respiratory failure with hypoxia: Secondary | ICD-10-CM

## 2018-02-24 DIAGNOSIS — I361 Nonrheumatic tricuspid (valve) insufficiency: Secondary | ICD-10-CM

## 2018-02-24 LAB — PROCALCITONIN: PROCALCITONIN: 9.21 ng/mL

## 2018-02-24 LAB — ECHOCARDIOGRAM COMPLETE
HEIGHTINCHES: 67 in
Weight: 2719.59 oz

## 2018-02-24 LAB — HEPARIN LEVEL (UNFRACTIONATED)
HEPARIN UNFRACTIONATED: 0.19 [IU]/mL — AB (ref 0.30–0.70)
Heparin Unfractionated: 0.13 IU/mL — ABNORMAL LOW (ref 0.30–0.70)

## 2018-02-24 LAB — BASIC METABOLIC PANEL
ANION GAP: 9 (ref 5–15)
BUN: 36 mg/dL — ABNORMAL HIGH (ref 6–20)
CALCIUM: 7.4 mg/dL — AB (ref 8.9–10.3)
CHLORIDE: 111 mmol/L (ref 101–111)
CO2: 19 mmol/L — AB (ref 22–32)
Creatinine, Ser: 0.64 mg/dL (ref 0.44–1.00)
GFR calc Af Amer: >60 mL/min (ref >60)
GFR calc non Af Amer: >60 mL/min (ref >60)
GLUCOSE: 183 mg/dL — AB (ref 65–99)
Potassium: 3.4 mmol/L — ABNORMAL LOW (ref 3.5–5.1)
Sodium: 139 mmol/L (ref 135–145)

## 2018-02-24 LAB — GLUCOSE, CAPILLARY
GLUCOSE-CAPILLARY: 170 mg/dL — AB (ref 65–99)
GLUCOSE-CAPILLARY: 149 mg/dL — AB (ref 65–99)
Glucose-Capillary: 148 mg/dL — ABNORMAL HIGH (ref 65–99)
Glucose-Capillary: 150 mg/dL — ABNORMAL HIGH (ref 65–99)
Glucose-Capillary: 151 mg/dL — ABNORMAL HIGH (ref 65–99)
Glucose-Capillary: 163 mg/dL — ABNORMAL HIGH (ref 65–99)

## 2018-02-24 LAB — CBC
HEMATOCRIT: 35.4 % — AB (ref 36.0–46.0)
Hemoglobin: 11.6 g/dL — ABNORMAL LOW (ref 12.0–15.0)
MCH: 31.4 pg (ref 26.0–34.0)
MCHC: 32.8 g/dL (ref 30.0–36.0)
MCV: 95.7 fL (ref 78.0–100.0)
Platelets: 230 10*3/uL (ref 150–400)
RBC: 3.7 MIL/uL — ABNORMAL LOW (ref 3.87–5.11)
RDW: 13.2 % (ref 11.5–15.5)
WBC: 16.1 10*3/uL — ABNORMAL HIGH (ref 4.0–10.5)

## 2018-02-24 LAB — LACTIC ACID, PLASMA: LACTIC ACID, VENOUS: 2.6 mmol/L — AB (ref 0.5–1.9)

## 2018-02-24 LAB — CORTISOL: Cortisol, Plasma: 25.3 ug/dL

## 2018-02-24 LAB — MAGNESIUM: Magnesium: 2.2 mg/dL (ref 1.7–2.4)

## 2018-02-24 MED ORDER — HEPARIN (PORCINE) IN NACL 100-0.45 UNIT/ML-% IJ SOLN
1500.0000 [IU]/h | INTRAMUSCULAR | Status: DC
  Administered 2018-02-24: 1450 [IU]/h via INTRAVENOUS
  Administered 2018-02-25 – 2018-02-26 (×2): 1550 [IU]/h via INTRAVENOUS
  Filled 2018-02-24 (×3): qty 250

## 2018-02-24 MED ORDER — VANCOMYCIN HCL 500 MG IV SOLR
500.0000 mg | INTRAVENOUS | Status: DC
  Administered 2018-02-24 – 2018-02-26 (×3): 500 mg via INTRAVENOUS
  Filled 2018-02-24 (×3): qty 500

## 2018-02-24 MED ORDER — LACTATED RINGERS IV BOLUS
500.0000 mL | Freq: Once | INTRAVENOUS | Status: AC
  Administered 2018-02-24: 500 mL via INTRAVENOUS

## 2018-02-24 MED ORDER — DIPHENHYDRAMINE HCL 50 MG/ML IJ SOLN
25.0000 mg | Freq: Once | INTRAMUSCULAR | Status: AC
  Administered 2018-02-24: 25 mg via INTRAVENOUS
  Filled 2018-02-24: qty 1

## 2018-02-24 MED ORDER — POTASSIUM CHLORIDE 20 MEQ/15ML (10%) PO SOLN
40.0000 meq | Freq: Once | ORAL | Status: AC
  Administered 2018-02-24: 40 meq
  Filled 2018-02-24: qty 30

## 2018-02-24 MED ORDER — ORAL CARE MOUTH RINSE
15.0000 mL | Freq: Two times a day (BID) | OROMUCOSAL | Status: DC
  Administered 2018-02-24 – 2018-03-01 (×11): 15 mL via OROMUCOSAL

## 2018-02-24 MED ORDER — HEPARIN (PORCINE) IN NACL 100-0.45 UNIT/ML-% IJ SOLN
1100.0000 [IU]/h | INTRAMUSCULAR | Status: DC
  Administered 2018-02-24: 1100 [IU]/h via INTRAVENOUS
  Filled 2018-02-24: qty 250

## 2018-02-24 MED ORDER — HEPARIN BOLUS VIA INFUSION
3000.0000 [IU] | Freq: Once | INTRAVENOUS | Status: AC
  Administered 2018-02-24: 3000 [IU] via INTRAVENOUS
  Filled 2018-02-24: qty 3000

## 2018-02-24 MED ORDER — POTASSIUM CHLORIDE 20 MEQ/15ML (10%) PO SOLN
20.0000 meq | ORAL | Status: AC
  Administered 2018-02-24 (×2): 20 meq
  Filled 2018-02-24 (×2): qty 15

## 2018-02-24 MED ORDER — METHYLPREDNISOLONE SODIUM SUCC 125 MG IJ SOLR
60.0000 mg | Freq: Four times a day (QID) | INTRAMUSCULAR | Status: DC
Start: 2018-02-24 — End: 2018-02-25
  Administered 2018-02-24 – 2018-02-25 (×5): 60 mg via INTRAVENOUS
  Filled 2018-02-24 (×4): qty 2

## 2018-02-24 MED ORDER — MAGNESIUM SULFATE 2 GM/50ML IV SOLN
2.0000 g | Freq: Once | INTRAVENOUS | Status: AC
  Administered 2018-02-24: 2 g via INTRAVENOUS
  Filled 2018-02-24: qty 50

## 2018-02-24 MED ORDER — PANTOPRAZOLE SODIUM 40 MG IV SOLR
40.0000 mg | INTRAVENOUS | Status: DC
  Administered 2018-02-24: 40 mg via INTRAVENOUS
  Filled 2018-02-24: qty 40

## 2018-02-24 MED ORDER — SODIUM CHLORIDE 0.9 % IV SOLN
0.0000 ug/min | INTRAVENOUS | Status: DC
  Administered 2018-02-24: 110 ug/min via INTRAVENOUS
  Administered 2018-02-24: 80 ug/min via INTRAVENOUS
  Administered 2018-02-24: 100 ug/min via INTRAVENOUS
  Administered 2018-02-24 (×2): 90 ug/min via INTRAVENOUS
  Administered 2018-02-24: 100 ug/min via INTRAVENOUS
  Filled 2018-02-24: qty 10
  Filled 2018-02-24 (×5): qty 1

## 2018-02-24 MED ORDER — POTASSIUM CHLORIDE 20 MEQ/15ML (10%) PO SOLN: 40.0000 meq | Freq: Once | ORAL | Status: DC

## 2018-02-24 MED ORDER — HEPARIN BOLUS VIA INFUSION
2000.0000 [IU] | Freq: Once | INTRAVENOUS | Status: AC
  Administered 2018-02-24: 2000 [IU] via INTRAVENOUS
  Filled 2018-02-24: qty 2000

## 2018-02-24 MED ORDER — FAMOTIDINE IN NACL 20-0.9 MG/50ML-% IV SOLN
20.0000 mg | Freq: Once | INTRAVENOUS | Status: AC
  Administered 2018-02-24: 20 mg via INTRAVENOUS
  Filled 2018-02-24: qty 50

## 2018-02-24 MED ORDER — SODIUM CHLORIDE 0.9 % IV SOLN
1.0000 g | Freq: Three times a day (TID) | INTRAVENOUS | Status: DC
  Administered 2018-02-24 – 2018-02-26 (×7): 1 g via INTRAVENOUS
  Filled 2018-02-24 (×8): qty 1

## 2018-02-24 MED ORDER — SODIUM CHLORIDE 0.9 % IV SOLN
1.0000 g | Freq: Two times a day (BID) | INTRAVENOUS | Status: DC
  Administered 2018-02-24: 1 g via INTRAVENOUS
  Filled 2018-02-24 (×2): qty 1

## 2018-02-24 MED ORDER — FAMOTIDINE IN NACL 20-0.9 MG/50ML-% IV SOLN
20.0000 mg | Freq: Two times a day (BID) | INTRAVENOUS | Status: DC
  Administered 2018-02-24 – 2018-02-25 (×3): 20 mg via INTRAVENOUS
  Filled 2018-02-24 (×4): qty 50

## 2018-02-24 MED ORDER — DEXTROSE 5 % AND 0.9 % NACL IV BOLUS: 1000.0000 mL | Freq: Once | INTRAVENOUS | Status: DC

## 2018-02-24 NOTE — Progress Notes (Signed)
ANTICOAGULATION CONSULT NOTE - Initial Consult  Pharmacy Consult for Heparin Indication: atrial fibrillation  Allergies  Allergen Reactions  . Zosyn [Piperacillin Sod-Tazobactam So] Anaphylaxis    Patient Measurements: Weight: 163 lb 9.3 oz (74.2 kg)  Vital Signs: Temp: 98.5 F (36.9 C) (03/30 2300) Temp Source: Axillary (03/30 2300) BP: 108/61 (03/31 0045)  Labs: Recent Labs    02/23/18 2149  HGB 12.9  HCT 38.8  PLT 188  CREATININE 0.71  TROPONINI 0.04*    Estimated Creatinine Clearance: 69.4 mL/min (by C-G formula based on SCr of 0.71 mg/dL).   Medical History: Past Medical History:  Diagnosis Date  . Abnormality of gait 04/13/2014  . Anxiety   . Breast cancer [facility])    s/p radiation  . Chronic fatigue 05/15/2016  . Depression   . Depression   . DJD (degenerative joint disease)   . Dyslipidemia   . Hypothyroid   . Meniere's disease   . Obese   . Osteopenia   . Osteopenia   . Parkinsonism ([place]) 12/24/2014  . 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 PSP (progressive supranuclear palsy) ([place]) 10/27/2015  . Sleep apnea   . TIA (transient ischemic attack)   . Wrist pain 2014    Medications:  Medications Prior to Admission  Medication Sig Dispense Refill Last Dose  . acetaminophen (TYLENOL) 650 MG CR tablet Take 650 mg by mouth every 8 (eight) hours as needed for pain.   Taking  . aspirin EC 81 MG tablet Take 81 mg by mouth daily.   Taking  . ATROPINE SULFATE PO Take 4 drops by mouth 2 (two) times daily.   Taking  . Dextromethorphan-Guaifenesin (ROBITUSSIN DM) 10-100 MG/5ML liquid Take 5 mLs by mouth every 12 (twelve) hours.   Taking  . docusate sodium (COLACE) 100 MG capsule Take 100 mg by mouth every evening.   Taking  . escitalopram (LEXAPRO) 10 MG tablet Take 10 mg by mouth at bedtime.   Taking  . HYDROcodone-acetaminophen (NORCO/VICODIN) 5-325 MG tablet Take 1 tablet by mouth every 6 (six) hours as needed for moderate pain.   Not Taking  . levothyroxine  (SYNTHROID, LEVOTHROID) 125 MCG tablet Take 112 mcg by mouth daily before breakfast.    Taking  . magnesium hydroxide (MILK OF MAGNESIA) 400 MG/5ML suspension Take 30 mLs by mouth daily as needed for mild constipation.   Not Taking  . oxybutynin (DITROPAN) 5 MG tablet Take 5 mg by mouth 2 (two) times daily.   Taking  . Polyvinyl Alcohol (LIQUITEARS OP) Place 1 drop into both eyes every 2 (two) hours as needed.   Not Taking    Assessment: 72 y.o. female with nerw onset Afib for heparin   Goal of Therapy:  Heparin level 0.3-0.7 units/ml Monitor platelets by anticoagulation protocol: Yes   Plan:  Heparin 3000 units IV bolus, then start heparin 1100 units/hr Check heparin level in 8 hours.   Paige Murphy, Bronson Curb 02/24/2018,12:51 AM

## 2018-02-24 NOTE — Progress Notes (Signed)
...  [facility] ADULT ICU REPLACEMENT PROTOCOL FOR AM LAB REPLACEMENT ONLY  The patient does apply for the [facility] Adult ICU Electrolyte Replacment Protocol based on the criteria listed below:   1. Is GFR >/= 40 ml/min? Yes.    Patient's GFR today is >60. Is urine output >/= 0.5 ml/kg/hr for the last 6 hours? Yes.   Patient's UOP is 0.64 ml/kg/hr 3. Is BUN < 60 mg/dL? Yes.    Patient's BUN today is 36 4. Abnormal electrolyte(s):K+ 3.4(5. Ordered repletion with: protocol 6. If a panic level lab has been reported, has the CCM MD in charge been notified? Yes.  .   Physician:  Dr.Sommer  Carlisle Beers 02/24/2018 4:51 AM

## 2018-02-24 NOTE — Progress Notes (Signed)
  Echocardiogram 2D Echocardiogram has been performed.  Paige Murphy 02/24/2018, 11:48 AM

## 2018-02-24 NOTE — Progress Notes (Signed)
Pharmacy Antibiotic Note  Paige Murphy is a 72 y.o. female admitted on 02/23/2018 with AMS/fevers/sepsis.  Pharmacy has been consulted for Vancomycin and Cefepime dosing.  Vancomycin 1250 mg given at 1430 at [facility]: Vancomycin 500 mg IV q24h Cefepime 1 g IV q12h  Weight: 163 lb 9.3 oz (74.2 kg)  Temp (24hrs), Avg:99 F (37.2 C), Min:98.5 F (36.9 C), Max:99.4 F (37.4 C)  Recent Labs  Lab 02/23/18 2149 02/23/18 2247  WBC 16.4*  --   CREATININE 0.71  --   LATICACIDVEN  --  3.7*    Estimated Creatinine Clearance: 69.4 mL/min (by C-G formula based on SCr of 0.71 mg/dL).    Allergies  Allergen Reactions  . Zosyn [Piperacillin Sod-Tazobactam So] Anaphylaxis    Caryl Pina 02/24/2018 12:35 AM

## 2018-02-24 NOTE — Progress Notes (Signed)
eLink Physician-Brief Progress Note Patient Name: Paige Murphy DOB: 02/12/1946 MRN: [id number]   Date of Service  02/24/2018  HPI/Events of Note  Multiple issues: 1. Troponin #1 - 0.04 and Lactic Acid = 3.7.  eICU Interventions  Will order: 1. Continue to trend Troponin. 2. 0.9 NaCl 1 liter IV over 1 hour now.      Intervention Category Major Interventions: Acid-Base disturbance - evaluation and management Intermediate Interventions: Diagnostic test evaluation  Sommer,Steven Eugene 02/24/2018, 12:04 AM

## 2018-02-24 NOTE — Progress Notes (Signed)
PCCM Interval Note  Patient Son, Sisters, and Brother in [place] all agree that due to patient poor current baseline and multiple chronic medical conditions that they agree to DNR status. EMR updated.   Hayden Pedro, AGACNP-BC [facility]  Pgr: [phone]  PCCM Pgr: [phone]

## 2018-02-24 NOTE — Progress Notes (Signed)
[facility] for Heparin Indication: atrial fibrillation  Allergies  Allergen Reactions  . Zosyn [Piperacillin Sod-Tazobactam So] Anaphylaxis    Patient Measurements: Height: 5\' 7"  (170.2 cm) Weight: 169 lb 15.6 oz (77.1 kg) IBW/kg (Calculated) : 61.6  Vital Signs: Temp: 97.4 F (36.3 C) (03/31 1150) Temp Source: Oral (03/31 1150) BP: 98/64 (03/31 1430)  Labs: Recent Labs    02/23/18 2149 02/24/18 0215 02/24/18 0641 02/24/18 0915 02/24/18 1422  HGB 12.9  --   --  11.6*  --   HCT 38.8  --   --  35.4*  --   PLT 188  --   --  230  --   HEPARINUNFRC  --   --  0.19*  --  0.13*  CREATININE 0.71 0.64  --   --   --   TROPONINI 0.04*  --   --   --   --     Estimated Creatinine Clearance: 71 mL/min (by C-G formula based on SCr of 0.64 mg/dL).   Medications:  . sodium chloride 10 mL/hr at 02/24/18 0600  . amiodarone 30 mg/hr (02/24/18 0930)  . aztreonam Stopped (02/24/18 1001)  . famotidine (PEPCID) IV    . heparin 1,250 Units/hr (02/24/18 0848)  . lactated ringers 100 mL/hr at 02/24/18 1035  . phenylephrine (NEO-SYNEPHRINE) Adult infusion 40 mcg/min (02/24/18 1340)  . vancomycin Stopped (02/24/18 2637)     Assessment: 72 y.o. female with new onset Afib on IV heparin. Heparin level remains low, now decreased at 0.13 after rate increase this AM. Hg down to 11.6, plt wnl. No bleed or issues with the infusion per discussion with RN.   Goal of Therapy:  Heparin level 0.3-0.7 units/ml Monitor platelets by anticoagulation protocol: Yes   Plan:  Heparin 2000 unit bolus x 1 Increase heparin to 1450 units/hr Check heparin level in 6 hours.  Monitor daily heparin level and CBC, s/sx bleeding  Elicia Lamp, PharmD, BCPS Clinical Pharmacist Clinical phone for 02/24/2018 until 3:30pm: [phone] If after 3:30pm, please call main pharmacy at: [phone] 02/24/2018 3:30 PM

## 2018-02-24 NOTE — Progress Notes (Signed)
[facility] Progress Note Patient Name: Paige Murphy DOB: 21-May-1946 MRN: [id number]   Date of Service  02/24/2018  HPI/Events of Note  Nurse states that rash secondary to prior Zosyn dose is getting worse.   eICU Interventions  Will order: 1. Benadryl 25 mg IV X 1 now.  2. Pepcid 20 mg IV X 1 now.      Intervention Category Major Interventions: Other:  Carl Butner Cornelia Copa 02/24/2018, 2:06 AM

## 2018-02-24 NOTE — Progress Notes (Addendum)
[place] for Heparin Indication: atrial fibrillation  Allergies  Allergen Reactions  . Zosyn [Piperacillin Sod-Tazobactam So] Anaphylaxis    Patient Measurements: Height: 5\' 7"  (170.2 cm) Weight: 169 lb 15.6 oz (77.1 kg) IBW/kg (Calculated) : 61.6  Vital Signs: Temp: 98.2 F (36.8 C) (03/31 0739) Temp Source: Oral (03/31 0739) BP: 100/59 (03/31 0830)  Labs: Recent Labs    02/23/18 2149 02/24/18 0215 02/24/18 0641  HGB 12.9  --   --   HCT 38.8  --   --   PLT 188  --   --   HEPARINUNFRC  --   --  0.19*  CREATININE 0.71 0.64  --   TROPONINI 0.04*  --   --     Estimated Creatinine Clearance: 71 mL/min (by C-G formula based on SCr of 0.64 mg/dL).   Medications:  . sodium chloride 10 mL/hr at 02/24/18 0600  . amiodarone 30 mg/hr (02/24/18 0600)  . aztreonam Stopped (02/24/18 0401)  . famotidine (PEPCID) IV    . heparin 1,250 Units/hr (02/24/18 0848)  . lactated ringers 100 mL/hr at 02/24/18 0600  . phenylephrine (NEO-SYNEPHRINE) Adult infusion 100 mcg/min (02/24/18 0848)  . vancomycin Stopped (02/24/18 9798)     Assessment: 72 y.o. female with new onset Afib on IV heparin.  Initial level below goal range this AM, although level drawn a little early.  No CBC this AM, was stable    Goal of Therapy:  Heparin level 0.3-0.7 units/ml Monitor platelets by anticoagulation protocol: Yes   Plan:  Increase heparin to 1250 units/hr Check heparin level in 6 hours.    Uvaldo Rising, BCPS  Clinical Pharmacist Pager [phone]  02/24/2018 9:05 AM

## 2018-02-24 NOTE — Progress Notes (Signed)
PULMONARY / CRITICAL CARE MEDICINE   Name: Paige Murphy MRN: [id number] DOB: 1946/11/20    ADMISSION DATE:  02/23/2018 CONSULTATION DATE:  02/23/2018  REFERRING MD:  Dr. Westly Pam  CHIEF COMPLAINT:  Sepsis   HISTORY OF PRESENT ILLNESS:   72 year old female with Progressive Supranuclear Palsy with Lock Jaw/Dyshagia and severe malnutrition s/p PEG, Depression/Anxiety, Hypothyroidism  Presents to ED from OSH on 3/30 from [place] presents to with progressive AMS. At baseline patient is non-verbal however alert and can respond with head movements, and is wheelchair/bed bound. Upon arrival to ED patient hypotensive with A.Fib RVR. Attempted cardioversion x 3 without success and started on Cardizem gtt. Concern for continued aspiration, recent PEG placement 3/30. CXR clear. Given Zosyn/Vancomcyin. Patient has known anaphylactic reaction to Zosyn, patient developed a rash, was given Benadryl, Solu-medrol, and EPI IM. Transported to [facility] for further management.   On arrival patient is hypotensive with systolic 22-29, given fluid and started on NEO gtt.    SUBJECTIVE:  Remains on pressor support , b/p improving  More alert, f/c , answers question, hard to interpret.  Rash resolved  Echo pending done  Family /sister updated at bedside     VITAL SIGNS: BP (!) 93/54   Temp 98.2 F (36.8 C) (Oral)   Resp (!) 21   Ht 5\' 7"  (1.702 m)   Wt 169 lb 15.6 oz (77.1 kg)   SpO2 95%   BMI 26.62 kg/m   HEMODYNAMICS:    VENTILATOR SETTINGS:    INTAKE / OUTPUT: I/O last 3 completed shifts: In: 2288.3 [I.V.:2168.3; Other:120] Out: 390 [Urine:390]  PHYSICAL EXAMINATION: General: ill appearing , frail  Neuro:  Alert, f/c , talking some   HEENT:  Dry mucosa , clench jaw Cardiovascular:  Irregular, Rate Controlled  Lungs:  Clear breath sounds  Abdomen:  PEG in place, non-distended  Musculoskeletal:  -edema , appears very deconditioned  Skin:  Warm, dry, intact   LABS:  BMET Recent Labs   Lab 02/23/18 2149 02/24/18 0215  NA 140 139  K 3.2* 3.4*  CL 109 111  CO2 18* 19*  BUN 40* 36*  CREATININE 0.71 0.64  GLUCOSE 203* 183*    Electrolytes Recent Labs  Lab 02/23/18 2149 02/24/18 0215  CALCIUM 7.4* 7.4*  MG 1.7 2.2  PHOS 1.9*  --     CBC Recent Labs  Lab 02/23/18 2149 02/24/18 0915  WBC 16.4* 16.1*  HGB 12.9 11.6*  HCT 38.8 35.4*  PLT 188 230    Coag's No results for input(s): APTT, INR in the last 168 hours.  Sepsis Markers Recent Labs  Lab 02/23/18 2149 02/23/18 2247 02/24/18 0215 02/24/18 0641  LATICACIDVEN  --  3.7*  --  2.6*  PROCALCITON 6.93  --  9.21  --     ABG No results for input(s): PHART, PCO2ART, PO2ART in the last 168 hours.  Liver Enzymes No results for input(s): AST, ALT, ALKPHOS, BILITOT, ALBUMIN in the last 168 hours.  Cardiac Enzymes Recent Labs  Lab 02/23/18 2149  TROPONINI 0.04*    Glucose Recent Labs  Lab 02/23/18 2314 02/24/18 0412 02/24/18 0735  GLUCAP 175* 170* 150*    Imaging Dg Chest Port 1 View  Result Date: 02/24/2018 CLINICAL DATA:  Acute respiratory failure with hypoxia EXAM: PORTABLE CHEST 1 VIEW COMPARISON:  Yesterday FINDINGS: Normal heart size and mediastinal contours. Chronic interstitial coarsening. Mild elevation of the left diaphragm. There is no edema, consolidation, effusion, or pneumothorax. IMPRESSION: No evidence of  active disease. Electronically Signed   By: Monte Fantasia M.D.   On: 02/24/2018 08:22     STUDIES:  CXR 3/31 >> NAD , chronic interstitial markings  ECHO 3/31 >>   CULTURES: Blood 3/30 >>  U/A 3/30 > Negative  ANTIBIOTICS: Zosyn 3/30  Vancomycin 3/30 >> Cefepime 3/31 >>   SIGNIFICANT EVENTS: 3/30 > Presents to ED   LINES/TUBES: PIV   DISCUSSION: 72 year old female whom resides in [place] presents to OSH ED with progressive AMS. At baseline patient is non-verbal however alert and can respond with head movements, and is wheelchair/bed bound. Upon  arrival to ED patient hypotensive with A.Fib RVR. Attempted cardioversion x 3 without success and started on Cardizem gtt. Concern for continued aspiration, recent PEG placement 3/30. CXR clear. Given Zosyn/Vancomcyin. Patient has known anaphylactic reaction to Zosyn, patient developed a rash, was given Benadryl, Solu-medrol, and EPI IM. Transported to [facility] for further management.   ASSESSMENT / PLAN:  PULMONARY A: At Risk for Respiratory Decompensation secondary to Encephalopathy and inability to protect airway  H/O OSA/OHS  P:   Maintain Oxygen Saturation >92 Monitor > Currently protecting airway  Pulmonary Hygiene   CARDIOVASCULAR A:  A.Fib RVR s/p failed cardioversion x 3 at OSH  Allergic Reaction secondary to zosyn administration >Given Solu-medrol, Benadryl, and EPI IM at OSH  H/O Dyslipidemia  P:  Cardiac Monitoring  Wean Neo to Maintain MAP > 65 (Cap off at 200 mcg if requirements increase will place CVC)  Heparin gtt  Continue Amiodarone   Cont medrol for now , look at wean off tomorrow   RENAL A:   Non-Gap Metabolic Acidosis with Lactic Acidosis  LA 4.7 > 3.7 Hypomagnesium , Hypokalemia  P:   Trend BMP  Replace electrolytes as indicated  LR @ 100 ml/hr  K and Mag replaced   GASTROINTESTINAL A:   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 placement on 3/22  P:   Begin TF  PPI   HEMATOLOGIC A:   VTE Prophylaxis/A.fib   P:  Trend CBC  Maintain Hbg >7 Heparin gtt    INFECTIOUS A:   Sepsis in setting of presumed chronic aspiration Recent PEG placement > clean site  Leukocytosis  WBC 16.4  PCT 6.93  P:   Trend WBC and Fever Curve  Trend LA and PCT  Follow Culture data  Cefepime/Vanocmyin   ENDOCRINE A:   Hypothyroidism    Cortisol 25 P:   Continue Synthroid  Trend Glucose    NEUROLOGIC A:   Metabolic Encephalopathy  H/O Supranuclear Palsy with disease progression, history of multiple falls > mainly uses wheelchair for mobility    -Followed BY Dr. Jannifer Franklin with [facility] Anxiety/Depression  P:   Monitor  Hold all sedating medications    FAMILY  - Updates: Family updated at bedside. Next of [place] Adult Son, however sister has been very involved in care. Family are discussing code status and will update [place]  3/31 Sister updated   - Inter-disciplinary family meet or Palliative Care meeting due by: 03/02/2018   Kohle Winner NP-C  [place] [facility]  [phone]   02/24/2018

## 2018-02-25 ENCOUNTER — Inpatient Hospital Stay (HOSPITAL_COMMUNITY): Payer: Medicare Other

## 2018-02-25 DIAGNOSIS — I480 Paroxysmal atrial fibrillation: Secondary | ICD-10-CM

## 2018-02-25 DIAGNOSIS — L8990 Pressure ulcer of unspecified site, unspecified stage: Secondary | ICD-10-CM

## 2018-02-25 DIAGNOSIS — G9341 Metabolic encephalopathy: Secondary | ICD-10-CM

## 2018-02-25 LAB — CBC
HCT: 28.8 % — ABNORMAL LOW (ref 36.0–46.0)
Hemoglobin: 9.9 g/dL — ABNORMAL LOW (ref 12.0–15.0)
MCH: 32.6 pg (ref 26.0–34.0)
MCHC: 34.4 g/dL (ref 30.0–36.0)
MCV: 94.7 fL (ref 78.0–100.0)
PLATELETS: 153 10*3/uL (ref 150–400)
RBC: 3.04 MIL/uL — ABNORMAL LOW (ref 3.87–5.11)
RDW: 13.3 % (ref 11.5–15.5)
WBC: 7.8 10*3/uL (ref 4.0–10.5)

## 2018-02-25 LAB — LACTIC ACID, PLASMA: Lactic Acid, Venous: 1.1 mmol/L (ref 0.5–1.9)

## 2018-02-25 LAB — BASIC METABOLIC PANEL
Anion gap: 9 (ref 5–15)
BUN: 36 mg/dL — AB (ref 6–20)
CO2: 20 mmol/L — ABNORMAL LOW (ref 22–32)
CREATININE: 0.52 mg/dL (ref 0.44–1.00)
Calcium: 7.8 mg/dL — ABNORMAL LOW (ref 8.9–10.3)
Chloride: 112 mmol/L — ABNORMAL HIGH (ref 101–111)
GFR calc Af Amer: >60 mL/min (ref >60)
GLUCOSE: 151 mg/dL — AB (ref 65–99)
Potassium: 3.6 mmol/L (ref 3.5–5.1)
SODIUM: 141 mmol/L (ref 135–145)

## 2018-02-25 LAB — PHOSPHORUS: Phosphorus: 1.3 mg/dL — ABNORMAL LOW (ref 2.5–4.6)

## 2018-02-25 LAB — PROCALCITONIN: PROCALCITONIN: 6.99 ng/mL

## 2018-02-25 LAB — MAGNESIUM: Magnesium: 2.2 mg/dL (ref 1.7–2.4)

## 2018-02-25 LAB — HEPARIN LEVEL (UNFRACTIONATED)
Heparin Unfractionated: 0.34 IU/mL (ref 0.30–0.70)
Heparin Unfractionated: 0.28 IU/mL — ABNORMAL LOW (ref 0.30–0.70)

## 2018-02-25 MED ORDER — HALOPERIDOL LACTATE 5 MG/ML IJ SOLN
0.5000 mg | Freq: Once | INTRAMUSCULAR | Status: AC
  Administered 2018-02-25: 0.5 mg via INTRAVENOUS
  Filled 2018-02-25: qty 1

## 2018-02-25 MED ORDER — JEVITY 1.2 CAL PO LIQD
1000.0000 mL | ORAL | Status: DC
Start: 2018-02-25 — End: 2018-02-25

## 2018-02-25 MED ORDER — METHYLPREDNISOLONE SODIUM SUCC 125 MG IJ SOLR
60.0000 mg | Freq: Two times a day (BID) | INTRAMUSCULAR | Status: DC
  Administered 2018-02-25 – 2018-02-26 (×2): 60 mg via INTRAVENOUS
  Filled 2018-02-25 (×2): qty 2

## 2018-02-25 MED ORDER — POTASSIUM PHOSPHATES 15 MMOLE/5ML IV SOLN
20.0000 meq | Freq: Once | INTRAVENOUS | Status: AC
  Administered 2018-02-25: 20 meq via INTRAVENOUS
  Filled 2018-02-25: qty 4.55

## 2018-02-25 MED ORDER — POLYVINYL ALCOHOL 1.4 % OP SOLN
1.0000 [drp] | OPHTHALMIC | Status: DC | PRN
  Administered 2018-02-25 – 2018-02-27 (×3): 1 [drp] via OPHTHALMIC
  Filled 2018-02-25: qty 15

## 2018-02-25 MED ORDER — OSMOLITE 1.2 CAL PO LIQD
1000.0000 mL | ORAL | Status: DC
Start: 2018-02-25 — End: 2018-02-27
  Administered 2018-02-25 – 2018-02-26 (×2): 1000 mL
  Filled 2018-02-25 (×4): qty 1000

## 2018-02-25 NOTE — Progress Notes (Signed)
[place] for Heparin Indication: atrial fibrillation  Allergies  Allergen Reactions  . Zosyn [Piperacillin Sod-Tazobactam So] Anaphylaxis    Patient Measurements: Height: 5\' 7"  (170.2 cm) Weight: 169 lb 15.6 oz (77.1 kg) IBW/kg (Calculated) : 61.6  Vital Signs: Temp: 97.8 F (36.6 C) (03/31 2335) Temp Source: Axillary (03/31 2335) BP: 113/52 (04/01 0030)  Labs: Recent Labs    02/23/18 2149 02/24/18 0215 02/24/18 0641 02/24/18 0915 02/24/18 1422 02/24/18 2259  HGB 12.9  --   --  11.6*  --   --   HCT 38.8  --   --  35.4*  --   --   PLT 188  --   --  230  --   --   HEPARINUNFRC  --   --  0.19*  --  0.13* 0.34  CREATININE 0.71 0.64  --   --   --   --   TROPONINI 0.04*  --   --   --   --   --     Estimated Creatinine Clearance: 71 mL/min (by C-G formula based on SCr of 0.64 mg/dL).   Medications:  . sodium chloride 10 mL/hr at 02/25/18 0000  . amiodarone 30 mg/hr (02/25/18 0000)  . aztreonam 1 g (02/24/18 1826)  . famotidine (PEPCID) IV 20 mg (02/24/18 2200)  . heparin 1,450 Units/hr (02/25/18 0000)  . lactated ringers 100 mL/hr at 02/25/18 0000  . phenylephrine (NEO-SYNEPHRINE) Adult infusion Stopped (02/24/18 1430)  . vancomycin Stopped (02/24/18 8099)     Assessment: 72 y.o. female with new onset Afib on IV heparin. Heparin level is therapeutic x 1 after rate increase   Goal of Therapy:  Heparin level 0.3-0.7 units/ml Monitor platelets by anticoagulation protocol: Yes   Plan:  Cont heparin at 1450 units/hr Confirmatory heparin level with AM labs  Narda Bonds, PharmD, [place] Pharmacist Phone: [phone]

## 2018-02-25 NOTE — Significant Event (Signed)
Patient's sister voices concerns that patient is not her baseline's mentation. Patient's sister states that patient is not talking like she normally would. Family is also concern that patient has been awake and hasn't slept, which is not her normal, per the sister. RN called Dr. Elwyn Reach who came to bedside to evaluate patient.    Jenney Brester

## 2018-02-25 NOTE — Progress Notes (Signed)
PULMONARY / CRITICAL CARE MEDICINE   Name: Paige Murphy MRN: [id number] DOB: [birth year]    ADMISSION DATE:  02/23/2018 CONSULTATION DATE:  02/23/2018  REFERRING MD:  Dr. Westly Pam  CHIEF COMPLAINT:  Sepsis   HISTORY OF PRESENT ILLNESS:   72 year old female with Progressive Supranuclear Palsy with Lock Jaw/Dyshagia and severe malnutrition s/p PEG, Depression/Anxiety, Hypothyroidism  Presents to ED from OSH on 3/30 from [place] presents to with progressive AMS. At baseline patient is non-verbal however alert and can respond with head movements, and is wheelchair/bed bound. Upon arrival to ED patient hypotensive with A.Fib RVR. Attempted cardioversion x 3 without success and started on Cardizem gtt. Concern for continued aspiration, recent PEG placement 3/30. CXR clear. Given Zosyn/Vancomcyin. Patient has known anaphylactic reaction to Zosyn, patient developed a rash, was given Benadryl, Solu-medrol, and EPI IM. Transported to [facility] for further management.   On arrival patient is hypotensive with systolic 35-46, given fluid and started on NEO gtt.    Interval Hx: Now off pressors, alert but confused. Family at bedside. No issues. No complaints  VITAL SIGNS: BP 101/60   Temp (!) 97.5 F (36.4 C) (Oral)   Resp (!) 22   Ht 5\' 7"  (1.702 m)   Wt 77.1 kg (169 lb 15.6 oz)   SpO2 96%   BMI 26.62 kg/m   HEMODYNAMICS:    VENTILATOR SETTINGS:    INTAKE / OUTPUT: I/O last 3 completed shifts: In: 7660.5 [I.V.:6730.5; Other:280; IV Piggyback:650] Out: 5681 [EXNTZ:0017]  PHYSICAL EXAMINATION: General: ill appearing , frail  Neuro:  Alert, f/c , talking some   HEENT:  Dry mucosa , clench jaw Cardiovascular:  Regular rate Lungs:  Clear breath sounds  Abdomen:  PEG in place, non-distended  Musculoskeletal:  -edema , appears very deconditioned  Skin:  Warm, dry, intact   LABS:  BMET Recent Labs  Lab 02/23/18 2149 02/24/18 0215 02/25/18 0610  NA 140 139 141  K 3.2* 3.4* 3.6  CL  109 111 112*  CO2 18* 19* 20*  BUN 40* 36* 36*  CREATININE 0.71 0.64 0.52  GLUCOSE 203* 183* 151*    Electrolytes Recent Labs  Lab 02/23/18 2149 02/24/18 0215 02/25/18 0610  CALCIUM 7.4* 7.4* 7.8*  MG 1.7 2.2 2.2  PHOS 1.9*  --  1.3*    CBC Recent Labs  Lab 02/23/18 2149 02/24/18 0915 02/25/18 0610  WBC 16.4* 16.1* 7.8  HGB 12.9 11.6* 9.9*  HCT 38.8 35.4* 28.8*  PLT 188 230 153    Coag's No results for input(s): APTT, INR in the last 168 hours.  Sepsis Markers Recent Labs  Lab 02/23/18 2149 02/23/18 2247 02/24/18 0215 02/24/18 0641 02/25/18 0610  LATICACIDVEN  --  3.7*  --  2.6* 1.1  PROCALCITON 6.93  --  9.21  --  6.99    ABG No results for input(s): PHART, PCO2ART, PO2ART in the last 168 hours.  Liver Enzymes No results for input(s): AST, ALT, ALKPHOS, BILITOT, ALBUMIN in the last 168 hours.  Cardiac Enzymes Recent Labs  Lab 02/23/18 2149  TROPONINI 0.04*    Glucose Recent Labs  Lab 02/24/18 0412 02/24/18 0735 02/24/18 1148 02/24/18 1538 02/24/18 1920 02/24/18 2332  GLUCAP 170* 150* 148* 151* 149* 163*    Imaging No results found.   STUDIES:  CXR 3/31 >> NAD , chronic interstitial markings  ECHO 3/31 >>   CULTURES: Blood 3/30 >>  U/A 3/30 > Negative  ANTIBIOTICS: Zosyn 3/30  Vancomycin 3/30 >> Cefepime 3/31 >>  SIGNIFICANT EVENTS: 3/30 > Presents to ED   LINES/TUBES: PIV   DISCUSSION: 72 year old female whom resides in [place] presents to OSH ED with progressive AMS. At baseline patient is non-verbal however alert and can respond with head movements, and is wheelchair/bed bound. Upon arrival to ED patient hypotensive with A.Fib RVR. Attempted cardioversion x 3 without success and started on Cardizem gtt. Concern for continued aspiration, recent PEG placement 3/30. CXR clear. Given Zosyn/Vancomcyin. Patient has known anaphylactic reaction to Zosyn, patient developed a rash, was given Benadryl, Solu-medrol, and EPI  IM. Transported to [facility] for further management.   ASSESSMENT / PLAN:  PULMONARY A: At Risk for Respiratory Decompensation secondary to Encephalopathy and inability to protect airway  H/O OSA/OHS  P:   Maintain Oxygen Saturation >92 Monitor > Currently protecting airway  Pulmonary Hygiene   CARDIOVASCULAR A:  A.Fib RVR s/p failed cardioversion x 3 at OSH  Allergic Reaction secondary to zosyn administration >Given Solu-medrol, Benadryl, and EPI IM at OSH  H/O Dyslipidemia  P:  Cardiac Monitoring  Now off neo  Heparin gtt  Continue Amiodarone   Wean solumderol to q12h  RENAL A:   Non-Gap Metabolic Acidosis with Lactic Acidosis  Improved  Hypomagnesium , Hypokalemia  P:   Trend BMP  Replace electrolytes  LR @ 100 ml/hr    GASTROINTESTINAL A:   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 placement on 3/22  P:    Dietician consult  Begin TF  PPI  Speech assesment when more cooperative   HEMATOLOGIC A:   VTE Prophylaxis/A.fib   P:  Trend CBC  Maintain Hbg >7 Heparin gtt    INFECTIOUS A:   Sepsis in setting of presumed chronic aspiration Recent PEG placement > clean site  Leukocytosis  WBC 16.4  PCT 6.93  P:   Trend WBC and Fever Curve  Trend LA and PCT  Follow Culture data  Aztreonam /Vanocmyin   ENDOCRINE A:   Hypothyroidism    Cortisol 25 P:   Continue Synthroid  Trend Glucose    NEUROLOGIC A:   Metabolic Encephalopathy  H/O Supranuclear Palsy with disease progression, history of multiple falls > mainly uses wheelchair for mobility   -Followed BY Dr. Jannifer Franklin with [facility]/Dep[facility]  P:   Monitor  Hold all sedating medications    FAMILY  - Updates: Family updated at bedside. DNR  Stable for floor   - Inter-disciplinary family meet or Palliative Care meeting due by: 03/02/2018

## 2018-02-25 NOTE — Significant Event (Signed)
Patient transferred safely to 3E04 via bed. Patient's sister at bedside and updated. Report given to receiving RN. No personal belongings at bedside of 2H03 that could transfer to new room. Patient settled in bed prior to RN leaving.     Marvie Calender

## 2018-02-25 NOTE — Progress Notes (Signed)
[place] for Heparin Indication: atrial fibrillation  Allergies  Allergen Reactions  . Zosyn [Piperacillin Sod-Tazobactam So] Anaphylaxis    Patient Measurements: Height: 5\' 7"  (170.2 cm) Weight: 169 lb 15.6 oz (77.1 kg) IBW/kg (Calculated) : 61.6  Vital Signs: Temp: 97.5 F (36.4 C) (04/01 0700) Temp Source: Oral (04/01 0700) BP: 98/62 (04/01 0800)  Labs: Recent Labs    02/23/18 2149 02/24/18 0215  02/24/18 0915 02/24/18 1422 02/24/18 2259 02/25/18 0610  HGB 12.9  --   --  11.6*  --   --  9.9*  HCT 38.8  --   --  35.4*  --   --  28.8*  PLT 188  --   --  230  --   --  153  HEPARINUNFRC  --   --    < >  --  0.13* 0.34 0.28*  CREATININE 0.71 0.64  --   --   --   --  0.52  TROPONINI 0.04*  --   --   --   --   --   --    < > = values in this interval not displayed.    Estimated Creatinine Clearance: 71 mL/min (by C-G formula based on SCr of 0.52 mg/dL).   Medications:  . sodium chloride 10 mL/hr at 02/25/18 0800  . amiodarone 30 mg/hr (02/25/18 0800)  . aztreonam 1 g (02/25/18 0200)  . famotidine (PEPCID) IV 20 mg (02/24/18 2200)  . heparin 1,450 Units/hr (02/25/18 0800)  . lactated ringers 100 mL/hr at 02/25/18 0800  . phenylephrine (NEO-SYNEPHRINE) Adult infusion Stopped (02/24/18 1430)  . vancomycin Stopped (02/25/18 0753)     Assessment: 72 y.o. female with new onset Afib on IV heparin. Heparin level is just below goal this am at (0.28). No bleeding issues noted, increase rate to 1550 units/hr and recheck in am.   Goal of Therapy:  Heparin level 0.3-0.7 units/ml Monitor platelets by anticoagulation protocol: Yes   Plan:  Increase heparin to 1550 units/hr Daily heparin level and CBC  Erin Hearing PharmD., BCPS Clinical Pharmacist 02/25/2018 8:43 AM

## 2018-02-25 NOTE — Progress Notes (Addendum)
Initial Nutrition Assessment  DOCUMENTATION CODES:   Not applicable  INTERVENTION:   Osmolite 1.2 @ 20 ml/hr increase by 10 ml every 8 hours to goal rate of 55 ml/hr.  Provides: 1584 kcals, 73 grams protein, 1082 ml free water. Meets 100% of protein and calorie needs.  Monitor magnesium, potassium, and phosphorus daily for at least 3 days, MD to replete as needed.   NUTRITION DIAGNOSIS:   Inadequate oral intake related to inability to eat as evidenced by NPO status.  GOAL:   Patient will meet greater than or equal to 90% of their needs  MONITOR:   Supplement acceptance, Labs, Weight trends, Diet advancement, TF tolerance, PO intake, I & O's  REASON FOR ASSESSMENT:   Consult Enteral/tube feeding initiation and management  ASSESSMENT:   Patient with PMH significant for Progressive Supranuclear Palsy with lock jaw/dysphagia, severe malnutrition, s/p PEG, and depression. Presents this admission with allergic reaction secondary to zosyn administration and A fib with RVR s/p failed cardioversion.    Pt non-verbal. Spoke with sister at bedside. Pt had PEG placed 3/22. At [facility] pt receives 60% of her nutrition through her PEG tube. The other 40% of nutrition comes from two mighty shakes and pureed food each day.  Home medication orders do not specify which tube feeding pt is using, and sister does not know the name. Home medication in records gives regimen of "Nutrition supplement" 180 ml four times daily with 39 ml Promod BID. Attempted to call [facility] but could not reach anyone. Pt requires all liquids to be thickened. Pt remains NPO. Plan for SLP to see pt this admission. Will begin pt on continuous feeding as she is at risk for aspiration. Plan to transition back to bolus feedings upon toleration of continuous.   Sister unsure of patients UBW/wt loss. Records are limited in weight history but show pt weighed 198 lb 01/2017 and 169 lb this stay (14.6% wt loss in one year,  not significant for time frame). Nutrition-Focused physical exam completed.   Medications reviewed and include: solumedrol, pepcid, LR @ 10 ml/hr, IV abx Labs reviewed: Phosphorus 1.3 (L)   NUTRITION - FOCUSED PHYSICAL EXAM:    Most Recent Value  Orbital Region  No depletion  Upper Arm Region  No depletion  Thoracic and Lumbar Region  Unable to assess  Buccal Region  No depletion  Temple Region  Mild depletion  Clavicle Bone Region  Mild depletion  Clavicle and Acromion Bone Region  No depletion  Scapular Bone Region  Unable to assess  Dorsal Hand  No depletion  Patellar Region  No depletion  Anterior Thigh Region  No depletion  Posterior Calf Region  No depletion  Edema (RD Assessment)  Mild  Hair  Reviewed  Eyes  Reviewed  Mouth  Reviewed  Skin  Reviewed  Nails  Reviewed     Diet Order:  No diet orders on file  EDUCATION NEEDS:   Not appropriate for education at this time  Skin:  Skin Assessment: Skin Integrity Issues: Skin Integrity Issues:: Stage I Stage I: sacrum  Last BM:  PTA  Height:   Ht Readings from Last 1 Encounters:  02/24/18 5\' 7"  (1.702 m)    Weight:   Wt Readings from Last 1 Encounters:  02/24/18 169 lb 15.6 oz (77.1 kg)    Ideal Body Weight:  61.3 kg  BMI:  Body mass index is 26.62 kg/m.  Estimated Nutritional Needs:   Kcal:  1450-1650 kcal  Protein:  70-80 g  Fluid:  >1.4 L/day    Mariana Single RD, LDN Clinical Nutrition Pager # [phone]

## 2018-02-25 NOTE — Progress Notes (Signed)
Received patient from [place], alert, none verbal, Amiodarone drip @ 16.7 and Heparin drip @ 15.5 infusing. Tube feeding ongoing @ 21ml/hr. V/s taken ( see flowsheet)

## 2018-02-26 ENCOUNTER — Inpatient Hospital Stay (HOSPITAL_COMMUNITY): Payer: Medicare Other

## 2018-02-26 LAB — GLUCOSE, CAPILLARY
GLUCOSE-CAPILLARY: 117 mg/dL — AB (ref 65–99)
GLUCOSE-CAPILLARY: 124 mg/dL — AB (ref 65–99)
Glucose-Capillary: 123 mg/dL — ABNORMAL HIGH (ref 65–99)

## 2018-02-26 LAB — HEPARIN LEVEL (UNFRACTIONATED): Heparin Unfractionated: 0.6 IU/mL (ref 0.30–0.70)

## 2018-02-26 LAB — CBC
HCT: 29.6 % — ABNORMAL LOW (ref 36.0–46.0)
Hemoglobin: 10 g/dL — ABNORMAL LOW (ref 12.0–15.0)
MCH: 31.9 pg (ref 26.0–34.0)
MCHC: 33.8 g/dL (ref 30.0–36.0)
MCV: 94.6 fL (ref 78.0–100.0)
PLATELETS: 170 10*3/uL (ref 150–400)
RBC: 3.13 MIL/uL — ABNORMAL LOW (ref 3.87–5.11)
RDW: 13.6 % (ref 11.5–15.5)
WBC: 7.7 10*3/uL (ref 4.0–10.5)

## 2018-02-26 MED ORDER — DOCUSATE SODIUM 50 MG/5ML PO LIQD
100.0000 mg | Freq: Two times a day (BID) | ORAL | Status: DC
  Administered 2018-02-26 – 2018-02-28 (×4): 100 mg
  Filled 2018-02-26 (×7): qty 10

## 2018-02-26 MED ORDER — HYDROCORTISONE 1 % EX CREA
TOPICAL_CREAM | Freq: Two times a day (BID) | CUTANEOUS | Status: DC
  Administered 2018-02-26 – 2018-03-01 (×4): via TOPICAL
  Filled 2018-02-26: qty 28

## 2018-02-26 MED ORDER — PHOSPHA 250 NEUTRAL 155-852-130 MG PO TABS
500.0000 mg | ORAL_TABLET | Freq: Three times a day (TID) | ORAL | Status: DC
  Administered 2018-02-26 – 2018-02-27 (×4): 500 mg via ORAL
  Filled 2018-02-26 (×5): qty 2

## 2018-02-26 MED ORDER — DIPHENHYDRAMINE HCL 12.5 MG/5ML PO ELIX
25.0000 mg | ORAL_SOLUTION | Freq: Two times a day (BID) | ORAL | Status: DC
  Administered 2018-02-26 (×2): 25 mg
  Filled 2018-02-26 (×3): qty 10

## 2018-02-26 MED ORDER — LEVOTHYROXINE SODIUM 112 MCG PO TABS
112.0000 ug | ORAL_TABLET | Freq: Every day | ORAL | Status: DC
  Administered 2018-02-27 – 2018-03-01 (×3): 112 ug
  Filled 2018-02-26 (×3): qty 1

## 2018-02-26 MED ORDER — AMIODARONE HCL 200 MG PO TABS
400.0000 mg | ORAL_TABLET | Freq: Two times a day (BID) | ORAL | Status: DC
  Administered 2018-02-26 – 2018-03-01 (×7): 400 mg
  Filled 2018-02-26 (×8): qty 2

## 2018-02-26 MED ORDER — RIVAROXABAN 20 MG PO TABS
20.0000 mg | ORAL_TABLET | Freq: Every day | ORAL | Status: DC
  Administered 2018-02-26 – 2018-02-28 (×3): 20 mg via ORAL
  Filled 2018-02-26 (×4): qty 1

## 2018-02-26 MED ORDER — ACETAMINOPHEN 160 MG/5ML PO SOLN
650.0000 mg | Freq: Four times a day (QID) | ORAL | Status: DC | PRN
  Filled 2018-02-26: qty 20.3

## 2018-02-26 MED ORDER — PREDNISONE 5 MG/ML PO CONC
40.0000 mg | Freq: Every day | ORAL | Status: DC
  Administered 2018-02-26 – 2018-02-27 (×2): 40 mg
  Filled 2018-02-26 (×2): qty 8

## 2018-02-26 MED ORDER — ONDANSETRON HCL 4 MG/2ML IJ SOLN: 4.0000 mg | Freq: Four times a day (QID) | INTRAMUSCULAR | Status: DC | PRN

## 2018-02-26 MED ORDER — DOXYCYCLINE HYCLATE 100 MG PO TABS
100.0000 mg | ORAL_TABLET | Freq: Two times a day (BID) | ORAL | Status: DC
  Administered 2018-02-26 – 2018-03-01 (×7): 100 mg
  Filled 2018-02-26 (×7): qty 1

## 2018-02-26 MED ORDER — POTASSIUM PHOSPHATE MONOBASIC 500 MG PO TABS
500.0000 mg | ORAL_TABLET | Freq: Three times a day (TID) | ORAL | Status: DC
  Filled 2018-02-26 (×2): qty 1

## 2018-02-26 MED ORDER — ESCITALOPRAM OXALATE 10 MG PO TABS
10.0000 mg | ORAL_TABLET | Freq: Every day | ORAL | Status: DC
  Administered 2018-02-26 – 2018-02-28 (×3): 10 mg
  Filled 2018-02-26 (×3): qty 1

## 2018-02-26 MED ORDER — FAMOTIDINE 40 MG/5ML PO SUSR
20.0000 mg | Freq: Two times a day (BID) | ORAL | Status: DC
  Administered 2018-02-26 – 2018-02-27 (×3): 20 mg
  Filled 2018-02-26 (×3): qty 2.5

## 2018-02-26 NOTE — Progress Notes (Addendum)
Pt was agitated, trying to pull the IV cords, haldol IV given as per MD ordered, pt responded to that, vitals stable,  mitten provided for the safety concerns, Sister is in bed side and is updating, other sister called over phone to RN for the updates as well overnight  Amiodarone drip continue @16 .7, Heparin drip continue @15 .5 , IVABX continue, turning and positioning maintained throughout the night, Foley catheter discontinued  Osmolyte continue @30  this point, will continue to monitor the [place], RN

## 2018-02-26 NOTE — Progress Notes (Addendum)
[facility] Progress Note  Patient: Paige Murphy EQA:[id number]   PCP: Rosalee Kaufman, PA-C DOB: 1946/01/29   DOA: 02/23/2018   DOS: 02/26/2018   Date of Service: the patient was seen and examined on 02/26/2018  Subjective: Patient nonverbal nonresponsive.  No acute changes overnight.  Still has a rash involving her torso as well as left arm.  Brief hospital course: Pt. with PMH of hypothyroidism, breast cancer, S/P chemo and radiation, A. fib, not on any anticoagulation, CVA, supranuclear palsy with blockage of syndrome, S/P PEG tube placement recently; admitted on 02/23/2018, presented with complaint of low blood pressure and unresponsive episode, was found to have anaphylactic reaction.  Patient presented to [facility] rocking home with hypotension and tachycardia, found to have A. fib with RVR.  Suspected to have sepsis and was given IV vancomycin, clindamycin, Zosyn.  Developed rash and worsening of hypotension after starting IV Zosyn.  Patient was given adrenaline, IV Solu-Medrol, Benadryl.  Sister at bedside who mentions patient was shocked twice as well for A. fib.  With her severe hypertension patient was transferred to [facility] ICU for shock.  Transferred out of the ICU on 02/26/2018. Currently further plan is continue current care with changing medication to oral.  Assessment and Plan: 1.  Shock ?  Septic shock most likely from cellulitis Anaphylactic shock Reportedly has rash before presentation to the ER and was actually started on antibiotics for coverage of cellulitis. Started on IV pressors Neo-Synephrine.  Was in the ICU. Currently blood pressure stable. Will continue oral prednisone with a rapid taper.  Continue Benadryl.  Topical hydrocortisone as well.  2.  A. fib with RVR. S/P attempted failed cardioversion in ER. On IV amiodarone drip.  Transition to oral amiodarone. On IV heparin, will transition to oral Xarelto per pharmacy. Continue to monitor on telemetry  for now. Echocardiogram shows preserved EF no significant wall motion abnormality.  3.  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 Follow with ENT, neurology as an outpatient. PEG tube insertion site does not look infected. Although per sister her redness started from the PEG tube site insertion and it looks like sunburn per her. Continue tube feeding. Monitor.  4.  Acute encephalopathy. Most likely metabolic in the setting of severe shock. Avoiding psychotropic medication. CT scan of the head unremarkable. We will check EEG to rule out any acute abnormality.  5.  Diffuse erythematous rash. Patient has a diffuse papular erythematous rash involving her torso as well as left arm. Continue Benadryl, Pepcid, steroids both oral and topical.  6.  Hypophosphatemia. Mild hypokalemia. Replacing orally with K-Phos.  7.  History of depression.  Pseudobulbar affect Patient on Lexapro at home, currently on hold.  8.  Protein calorie malnutrition with weight loss. Continue tube feeding for now.  9.  Hypothyroidism. Resuming home dose of Synthroid.  10.  Recent PEG tube placement. Tube site looks okay, no infection.  Monitor.  11. Non-anion gap metabolic acidosis. ?  From hyperalimentation. Now better. Monitor.  Diet: On tube feeding DVT Prophylaxis: on therapeutic anticoagulation.  Advance goals of care discussion: DNR/DNI  Family Communication: Sister was present at bedside, at the time of interview. Opportunity was given to ask question and all questions were answered satisfactorily.   Disposition:  Discharge to back to SNF pending improvement.  Consultants: PCCM  Procedures: Echocardiogram  Study Conclusions  - Left ventricle: The cavity size was normal. Systolic function was   normal. The estimated ejection fraction was  in the range of 60%   to 65%. Wall motion was normal; there were no regional wall   motion abnormalities. Left  ventricular diastolic function   parameters were normal. - Aortic valve: Trileaflet; mildly thickened, mildly calcified   leaflets. - Mitral valve: There was trivial regurgitation.  Antibiotics: Anti-infectives (From admission, onward)   Start     Dose/Rate Route Frequency Ordered Stop   02/24/18 0600  vancomycin (VANCOCIN) 500 mg in sodium chloride 0.9 % 100 mL IVPB  Status:  Discontinued     500 mg 100 mL/hr over 60 Minutes Intravenous Every 24 hours 02/24/18 0051 02/26/18 0807   02/24/18 0200  aztreonam (AZACTAM) 1 g in sodium chloride 0.9 % 100 mL IVPB  Status:  Discontinued     1 g 200 mL/hr over 30 Minutes Intravenous Every 8 hours 02/24/18 0149 02/26/18 0807   02/24/18 0130  ceFEPIme (MAXIPIME) 1 g in sodium chloride 0.9 % 100 mL IVPB  Status:  Discontinued     1 g 200 mL/hr over 30 Minutes Intravenous Every 12 hours 02/24/18 0051 02/24/18 0144       Objective: Physical Exam: Vitals:   02/25/18 1902 02/25/18 2129 02/25/18 2359 02/26/18 0623  BP: (!) 108/59 (!) 113/59 (!) 112/54 (!) 105/59  Pulse: 68 69 70 70  Resp:  20 20 20   Temp: 98.2 F (36.8 C) (!) 97.3 F (36.3 C) (!) 97.5 F (36.4 C) 97.6 F (36.4 C)  TempSrc: Oral Oral Oral Oral  SpO2: 95% 95% 96% 94%  Weight: 77.2 kg (170 lb 3.1 oz)   77.7 kg (171 lb 4.8 oz)  Height: 5\' 9"  (1.753 m)       Intake/Output Summary (Last 24 hours) at 02/26/2018 1116 Last data filed at 02/26/2018 0507 Gross per 24 hour  Intake 809.6 ml  Output 720 ml  Net 89.6 ml   Filed Weights   02/24/18 1500 02/25/18 1902 02/26/18 0623  Weight: 77.1 kg (169 lb 15.6 oz) 77.2 kg (170 lb 3.1 oz) 77.7 kg (171 lb 4.8 oz)   General: Alert, Awake and nonverbal, lethargic, unable to follow commands. Appear in moderate distress, affect blunted Eyes: PERRL, Conjunctiva normal ENT: Oral Mucosa clear dry. Neck: difficult to assess JVD, no Abnormal Mass Or lumps Cardiovascular: S1 and S2 Present, aortic systolic Murmur, Peripheral Pulses  Present Respiratory: normal respiratory effort, Bilateral Air entry equal and Decreased, no use of accessory muscle, Clear to Auscultation, no Crackles, no wheezes Abdomen: Bowel Sound present, Soft and no tenderness, no hernia Skin: Diffuse papular redness torso and left arm with warmth, no Rash, no induration Extremities: no Pedal edema, no calf tenderness Neurologic: Grossly no new focal neuro deficit. Data Reviewed: CBC: Recent Labs  Lab 02/23/18 2149 02/24/18 0915 02/25/18 0610 02/26/18 0424  WBC 16.4* 16.1* 7.8 7.7  NEUTROABS 15.6*  --   --   --   HGB 12.9 11.6* 9.9* 10.0*  HCT 38.8 35.4* 28.8* 29.6*  MCV 94.6 95.7 94.7 94.6  PLT 188 230 153 443   Basic Metabolic Panel: Recent Labs  Lab 02/23/18 2149 02/24/18 0215 02/25/18 0610  NA 140 139 141  K 3.2* 3.4* 3.6  CL 109 111 112*  CO2 18* 19* 20*  GLUCOSE 203* 183* 151*  BUN 40* 36* 36*  CREATININE 0.71 0.64 0.52  CALCIUM 7.4* 7.4* 7.8*  MG 1.7 2.2 2.2  PHOS 1.9*  --  1.3*    Liver Function Tests: No results for input(s): AST, ALT, ALKPHOS, BILITOT, PROT,  ALBUMIN in the last 168 hours. No results for input(s): LIPASE, AMYLASE in the last 168 hours. No results for input(s): AMMONIA in the last 168 hours. Coagulation Profile: No results for input(s): INR, PROTIME in the last 168 hours. Cardiac Enzymes: Recent Labs  Lab 02/23/18 2149  TROPONINI 0.04*   BNP (last 3 results) No results for input(s): PROBNP in the last 8760 hours. CBG: Recent Labs  Lab 02/24/18 0735 02/24/18 1148 02/24/18 1538 02/24/18 1920 02/24/18 2332  GLUCAP 150* 148* 151* 149* 163*   Studies: Ct Head Wo Contrast  Result Date: 02/25/2018 CLINICAL DATA:  Mental status changes EXAM: CT HEAD WITHOUT CONTRAST TECHNIQUE: Contiguous axial images were obtained from the base of the skull through the vertex without intravenous contrast. COMPARISON:  01/06/2018 FINDINGS: Brain: Global atrophy. There are chronic ischemic changes in the  periventricular white matter. There is no mass effect, midline shift, or acute hemorrhage. Vascular: No hyperdense vessel or unexpected calcification. Skull: Cranium is intact. Sinuses/Orbits: Mastoid air cells are clear. Visualized paranasal sinuses are clear. Orbits are unremarkable. Other: Noncontributory IMPRESSION: Chronic changes.  No acute intracranial pathology. Electronically Signed   By: Marybelle Killings M.D.   On: 02/25/2018 18:32    Scheduled Meds: . amiodarone  400 mg Per Tube BID  . famotidine  20 mg Per Tube BID  . feeding supplement (OSMOLITE 1.2 CAL)  1,000 mL Per Tube Q24H  . mouth rinse  15 mL Mouth Rinse BID   Continuous Infusions: . sodium chloride 10 mL/hr at 02/25/18 1900   PRN Meds: sodium chloride, polyvinyl alcohol  Time spent: 35 minutes  Author: Berle Mull, MD [facility] Pager: [phone] 02/26/2018 11:16 AM  If 7PM-7AM, please contact night-coverage at [email or web address], password [facility]

## 2018-02-26 NOTE — Discharge Instructions (Signed)

## 2018-02-26 NOTE — Progress Notes (Addendum)
ANTICOAGULATION CONSULT NOTE   Pharmacy Consult:  Heparin Indication: atrial fibrillation  Allergies  Allergen Reactions  . Zosyn [Piperacillin Sod-Tazobactam So] Anaphylaxis    Patient Measurements: Height: 5\' 9"  (175.3 cm) Weight: 171 lb 4.8 oz (77.7 kg) IBW/kg (Calculated) : 66.2  Heparin dosing weight = 77 kg  Vital Signs: Temp: 97.6 F (36.4 C) (04/02 0623) Temp Source: Oral (04/02 0623) BP: 105/59 (04/02 0623) Pulse Rate: 70 (04/02 0623)  Labs: Recent Labs    02/23/18 2149 02/24/18 0215  02/24/18 0915  02/24/18 2259 02/25/18 0610 02/26/18 0424  HGB 12.9  --   --  11.6*  --   --  9.9* 10.0*  HCT 38.8  --   --  35.4*  --   --  28.8* 29.6*  PLT 188  --   --  230  --   --  153 170  HEPARINUNFRC  --   --    < >  --    < > 0.34 0.28* 0.60  CREATININE 0.71 0.64  --   --   --   --  0.52  --   TROPONINI 0.04*  --   --   --   --   --   --   --    < > = values in this interval not displayed.    Estimated Creatinine Clearance: 69.4 mL/min (by C-G formula based on SCr of 0.52 mg/dL).     Assessment: 72 y.o. female with new-onset Afib to continue on IV heparin.  Heparin level is therapeutic and toward the high end of normal.  No bleeding reported.   Goal of Therapy:  Heparin level 0.3-0.7 units/ml Monitor platelets by anticoagulation protocol: Yes    Plan:  Reduce heparin gtt slightly to 1500 units/hr Daily heparin level and CBC F/U with transitioning to PO anticoagulation   Angeliyah Kirkey D. Mina Marble, PharmD, BCPS Pager:  [phone] 02/26/2018, 7:38 AM   ============================  Addendum: Transition to Xarelto CrCL > 50 ml/min Xarelto 20mg  PO daily with supper, start today with lunch   Rodarius Kichline D. Mina Marble, PharmD, BCPS Pager:  [phone] 02/26/2018, 11:28 AM

## 2018-02-26 NOTE — Procedures (Signed)
History: 72 year old female with a history of PSP who is presenting with acute encephalopathy in the setting of shock.  Sedation: None  Technique: This is a 21 channel routine scalp EEG performed at the bedside with bipolar and monopolar montages arranged in accordance to the international 10/20 system of electrode placement. One channel was dedicated to EKG recording.    Background: There is a poorly sustained posterior dominant rhythm of 7-8 Hz.  The background is dominated, however, by generalized irregular theta slow activity.  There is also some admixed irregular delta.  Photic stimulation: Physiologic driving is not performed  EEG Abnormalities: 1) generalized irregular slow activity  Clinical Interpretation: This normal EEG is recorded in the waking and drowsy state. There was no seizure or seizure predisposition recorded on this study. Please note that a normal EEG does not preclude the possibility of epilepsy.   Roland Rack, MD [facility] [phone]  If 7pm- 7am, please page neurology on call as listed in [place].

## 2018-02-26 NOTE — Progress Notes (Signed)
Tube Feeding rate increased by 10 ml per order.Present rate  50 ml/hr.TF tolerated.Will monitor

## 2018-02-26 NOTE — Progress Notes (Signed)
EEG complete - results pending 

## 2018-02-27 ENCOUNTER — Encounter (HOSPITAL_COMMUNITY): Payer: Self-pay

## 2018-02-27 ENCOUNTER — Other Ambulatory Visit: Payer: Self-pay

## 2018-02-27 DIAGNOSIS — Z978 Presence of other specified devices: Secondary | ICD-10-CM

## 2018-02-27 DIAGNOSIS — Z960 Presence of urogenital implants: Secondary | ICD-10-CM

## 2018-02-27 DIAGNOSIS — A419 Sepsis, unspecified organism: Principal | ICD-10-CM

## 2018-02-27 DIAGNOSIS — T782XXS Anaphylactic shock, unspecified, sequela: Secondary | ICD-10-CM

## 2018-02-27 DIAGNOSIS — T782XXA Anaphylactic shock, unspecified, initial encounter: Secondary | ICD-10-CM

## 2018-02-27 DIAGNOSIS — Z931 Gastrostomy status: Secondary | ICD-10-CM

## 2018-02-27 LAB — BASIC METABOLIC PANEL
ANION GAP: 7 (ref 5–15)
BUN: 22 mg/dL — AB (ref 6–20)
CO2: 21 mmol/L — ABNORMAL LOW (ref 22–32)
Calcium: 7.6 mg/dL — ABNORMAL LOW (ref 8.9–10.3)
Chloride: 113 mmol/L — ABNORMAL HIGH (ref 101–111)
Creatinine, Ser: 0.48 mg/dL (ref 0.44–1.00)
GFR calc Af Amer: >60 mL/min (ref >60)
GLUCOSE: 188 mg/dL — AB (ref 65–99)
POTASSIUM: 3.1 mmol/L — AB (ref 3.5–5.1)
Sodium: 141 mmol/L (ref 135–145)

## 2018-02-27 LAB — GLUCOSE, CAPILLARY
GLUCOSE-CAPILLARY: 151 mg/dL — AB (ref 65–99)
Glucose-Capillary: 150 mg/dL — ABNORMAL HIGH (ref 65–99)
Glucose-Capillary: 166 mg/dL — ABNORMAL HIGH (ref 65–99)
Glucose-Capillary: 167 mg/dL — ABNORMAL HIGH (ref 65–99)

## 2018-02-27 LAB — MAGNESIUM: Magnesium: 1.8 mg/dL (ref 1.7–2.4)

## 2018-02-27 LAB — PHOSPHORUS: PHOSPHORUS: 1.9 mg/dL — AB (ref 2.5–4.6)

## 2018-02-27 MED ORDER — PHOSPHA 250 NEUTRAL 155-852-130 MG PO TABS
500.0000 mg | ORAL_TABLET | Freq: Three times a day (TID) | ORAL | Status: DC
  Administered 2018-02-27 – 2018-02-28 (×4): 500 mg
  Filled 2018-02-27 (×8): qty 2

## 2018-02-27 MED ORDER — PREDNISONE 5 MG/ML PO CONC: 20.0000 mg | Freq: Every day | ORAL | Status: DC

## 2018-02-27 MED ORDER — OSMOLITE 1.2 CAL PO LIQD
1000.0000 mL | ORAL | Status: DC
  Administered 2018-02-27: 1000 mL
  Filled 2018-02-27 (×2): qty 1000

## 2018-02-27 MED ORDER — OXYBUTYNIN CHLORIDE 5 MG PO TABS
5.0000 mg | ORAL_TABLET | Freq: Three times a day (TID) | ORAL | Status: DC
Start: 2018-02-27 — End: 2018-03-01
  Administered 2018-02-27 – 2018-03-01 (×6): 5 mg
  Filled 2018-02-27 (×6): qty 1

## 2018-02-27 NOTE — Clinical Social Work Note (Signed)
Clinical Social Work Assessment  Patient Details  Name: Laketra Bowdish MRN: [id number] Date of Birth: [birth year]  Date of referral:  02/27/18               Reason for consult:  Facility Placement(from [facility])                Permission sought to share information with:  Facility Sport and exercise psychologist, Family Supports Permission granted to share information::  No(patient disoriented and non verbal)  Name::     Mosie Epstein  Agency::  [facility]  Relationship::  sister  Contact Information:  [phone]  Housing/Transportation Living arrangements for the past 2 months:  [place] of Information:  Facility, Medical Team, Siblings Patient Interpreter Needed:  None Criminal Activity/Legal Involvement Pertinent to Current Situation/Hospitalization:  No - Comment as needed Significant Relationships:  Siblings Lives with:  Facility Resident Do you feel safe going back to the place where you live?  Yes Need for family participation in patient care:  Yes (Comment)  Care giving concerns: Patient is long term care resident at [facility] SNF.  Social Worker assessment / plan: CSW spoke to SNF liaison and confirmed patient is long term care resident there. CSW also spoke to sister via phone; patient is disoriented. Sister confirmed patient is resident at [facility] and plans for patient to return there at discharge. CSW to follow and support with discharge planning.  Employment status:  Retired Forensic scientist:  Medicare PT Recommendations:  Not assessed at this time Information / Referral to community resources:  [place]  Patient/Family's Response to care: Sister appreciative of care.  Patient/Family's Understanding of and Emotional Response to Diagnosis, Current Treatment, and Prognosis: Sister with understanding of patient's condition and hopeful for patient to return to SNF.  Emotional Assessment Appearance:  Appears stated  age Attitude/Demeanor/Rapport:  Unable to Assess Affect (typically observed):  Unable to Assess Orientation:  Oriented to Self Alcohol / Substance use:  Not Applicable Psych involvement (Current and /or in the community):  No (Comment)  Discharge Needs  Concerns to be addressed:  Care Coordination, Discharge Planning Concerns Readmission within the last 30 days:  No Current discharge risk:  Physical Impairment, Cognitively Impaired Barriers to Discharge:  Continued Medical Work up   Estanislado Emms, LCSW 02/27/2018, 10:59 AM

## 2018-02-27 NOTE — Progress Notes (Signed)
Night RN giving meds via peg tube upon shift change; pt is impulsive/mildly combative this am.  Mittens applied to both hands. Per report, pt refused labs this morning. Will re-assess willingness to allow later int the morning.

## 2018-02-27 NOTE — Progress Notes (Signed)
Call placed to phlebotomy to request second attempt to draw labs, per MD request.

## 2018-02-27 NOTE — Progress Notes (Signed)
Page to Dr Wynelle Cleveland  3e04 Paige Murphy. lab canceled all labs after failed attempt at 5, so they only drew add on. please order desired labs as STAT.

## 2018-02-27 NOTE — Progress Notes (Signed)
Page to Dr Wynelle Cleveland  3e04 Paige Murphy. Refusing labs/CBG, frequent PVCs this am.

## 2018-02-27 NOTE — NC FL2 (Signed)
[place] LEVEL OF CARE SCREENING TOOL     IDENTIFICATION  Patient Name: Paige Murphy Birthdate: 1946/07/02 Sex: female Admission Date (Current Location): 02/23/2018  [facility] and [place] Number:  Herbalist and Address:  [facility] [place] [place] [address] [place], [place] [address]      Provider Number: [id number]  Attending Physician Name and Address:  Debbe Odea, MD  Relative Name and Phone Number:  Mosie Epstein, sister, [phone]    Current Level of Care: Hospital Recommended Level of Care: [place] Prior Approval Number:    Date Approved/Denied:   PASRR Number: [id number]  Discharge Plan: SNF    Current Diagnoses: Patient Active Problem List   Diagnosis Date Noted  . Pressure injury of skin 02/25/2018  . Acute metabolic encephalopathy   . Acute respiratory failure with hypoxia ([place])   . Shock circulatory ([place])   . A-fib ([place]) 02/23/2018  . 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 Chronic fatigue 05/15/2016  . PSP (progressive supranuclear palsy) ([place]) 10/27/2015  . Parkinsonism ([place]) 12/24/2014  . Abnormality of gait 04/13/2014    Orientation RESPIRATION BLADDER Height & Weight     Self  Normal Incontinent, External catheter Weight: 186 lb 15.2 oz (84.8 kg) Height:  5\' 9"  (175.3 cm)  BEHAVIORAL SYMPTOMS/MOOD NEUROLOGICAL BOWEL NUTRITION STATUS        Feeding tube(PEG tube feeds - please see DC summary)  AMBULATORY STATUS COMMUNICATION OF NEEDS Skin   Total Care(baseline) Verbally Normal                       Personal Care Assistance Level of Assistance  (baseline)           Functional Limitations Info  Sight, Hearing, Speech Sight Info: Adequate Hearing Info: Adequate Speech Info: (not verbal)    SPECIAL CARE FACTORS FREQUENCY  PT (By licensed PT), OT (By licensed OT)     PT Frequency: 5x/week OT Frequency: 5x/week            Contractures Contractures Info: Not present     Additional Factors Info  Code Status, Allergies, Psychotropic Code Status Info: DNR Allergies Info: Zosyn Piperacillin Sod-tazobactam So Psychotropic Info: lexapro         Current Medications (02/27/2018):  This is the current hospital active medication list Current Facility-Administered Medications  Medication Dose Route Frequency Provider Last Rate Last Dose  . 0.9 %  sodium chloride infusion  250 mL Intravenous PRN Omar Person, NP 10 mL/hr at 02/25/18 1900    . acetaminophen (TYLENOL) solution 650 mg  650 mg Per Tube Q6H PRN Lavina Hamman, MD      . amiodarone (PACERONE) tablet 400 mg  400 mg Per Tube BID Lavina Hamman, MD   400 mg at 02/26/18 2026  . docusate (COLACE) 50 MG/5ML liquid 100 mg  100 mg Per Tube BID Lavina Hamman, MD   100 mg at 02/26/18 1757  . doxycycline (VIBRA-TABS) tablet 100 mg  100 mg Per Tube Q12H Lavina Hamman, MD   100 mg at 02/26/18 2026  . escitalopram (LEXAPRO) tablet 10 mg  10 mg Per Tube QHS Lavina Hamman, MD   10 mg at 02/26/18 2027  . famotidine (PEPCID) 40 MG/5ML suspension 20 mg  20 mg Per Tube BID Lavina Hamman, MD   20 mg at 02/26/18 2023  . feeding supplement (OSMOLITE 1.2 CAL) liquid 1,000 mL  1,000 mL Per  Tube Q24H Debbe Odea, MD      . hydrocortisone cream 1 %   Topical BID Lavina Hamman, MD      . levothyroxine (SYNTHROID, LEVOTHROID) tablet 112 mcg  112 mcg Per Tube QAC breakfast Lavina Hamman, MD   112 mcg at 02/27/18 [phone]  . MEDLINE mouth rinse  15 mL Mouth Rinse BID Scatliffe, Rise Paganini, MD   15 mL at 02/26/18 2022  . ondansetron (ZOFRAN) injection 4 mg  4 mg Intravenous Q6H PRN Lavina Hamman, MD      . PHOSPHA 250 NEUTRAL [phone] MG TABS 500 mg  500 mg Oral TID WC & HS Lavina Hamman, MD   500 mg at 02/27/18 0651  . polyvinyl alcohol (LIQUIFILM TEARS) 1.4 % ophthalmic solution 1 drop  1 drop Both Eyes PRN Scatliffe, Rise Paganini, MD   1 drop at 02/25/18 2109  . [START ON 02/28/2018] predniSONE 5 MG/ML  concentrated solution 20 mg  20 mg Per Tube Q breakfast Rizwan, Eunice Blase, MD      . rivaroxaban (XARELTO) tablet 20 mg  20 mg Oral Q supper Dang, Thuy D, RPH   20 mg at 02/26/18 1139     Discharge Medications: Please see discharge summary for a list of discharge medications.  Relevant Imaging Results:  Relevant Lab Results:   Additional Information SSN: [id number]  Estanislado Emms, LCSW

## 2018-02-27 NOTE — Progress Notes (Addendum)
PROGRESS NOTE    Paige Murphy   ZJQ:[id number]  DOB: February 01, 1946  DOA: 02/23/2018 PCP: Rosalee Kaufman, PA-C   Brief Narrative:  Paige Murphy 72 yr old female from the [facility] in [place] with  Hypothyroidism, Breast CA s/p partial mastectomy and radiation,  compression fracture s/p sx, Afib, CVA on baby aspirin, Progressive Supranuclear palsy (nonverbal, mostly wheelchair bound) PEG tube & indwelling foley. She presented to [facility] ED with fevers that started on 02/20/18 and vomiting. She was worked up for infection and received 3L bolus NS and Vancomycin,Clindamycin and Zosyn. She has an allergy to Zosyn and developed a rash and possible anaphylaxis per their documentation.  She received Adrenaline, Methylpred 125mg , Diphenydramine. She also was noted to be in Afib RVR, attempted cardioversion x 3, and received Cardizem. BP was noted to subsequently drop. Transferred to [facility] ICU. On presentation to [facility] ICU, BP was in 90s- she was on Neosynephrine.  The suspicion was aspiration pneumonia.  Subjective: Sister states patient is confused and very sleepy. She states that the patient is actually able to talk usually in the mornings but this wanes as the day goes on.  The patient is not awake enough to communicate with me.   Assessment & Plan:   Principal Problem:   Sepsis  - presented with fever, leukocytosis and vomiting- ICU team suspected aspiration pneumonia - initial CXR clear, chronic foley> UA rare bacteria and 6-30 WBC, neg leukocytes and nitrites (unclear if UTI) - given antibiotics broad spectrum antibiotics - now on Doxy which was started on 4/2  Active Problems:   Anaphylactic reaction/ circulatory shock Maculopapular rash  - occurred after Zosyn in ED- per sister she was on antibiotics for cellulitis as well as outpt   - treated with steroids, Benadryl, Pepcid, pressors - currently still on Benadryl BID, Pepcid BID and Prednisone which will be  stopped  Lethargy/ agitation- acute metabolic encephalopathy - currently mostly sleepy but in between becomes combative- not her baseline per sister - EEG shows slowing - stop BID IV Benadryl and steroids and follow for improvement - Addendum: per RN, she smiled at a visitor, is no longer combative     PSP (progressive supranuclear palsy) - per sister, able to speak after she awakens- mostly bed bound   Locked jaw - recent PEG- Protein calorie malnutrition with weight loss. -recent PEG on 3/22-  tube feeds at goal-    Chronic foley - cont chronic foley and resume Ditropan per home dose (resumed today)  PAF with RVR on admission S/P  failed cardioversion (2 shocks) in ER - on per tube Amio and xarelto now - in sinus rhythm  QTC > 500 - hold QTc prolonging meds- follow labs - awaiting  labs  Hypophosphatemia - replacing with Neutraphos  Depression - on Lexapro at home   NOTE: no labs results from this AM other than a phos level- RN is checking with lab Addendum: patient was agitated this AM and phlebotomist cancelled all labs- When RN called phlebotomist to request labs as patient was cooperative, they only drew the Phos which I ordered this AM although the orders were still in our computer system.    DVT prophylaxis: Xarelto Code Status: DNR Family Communication: sister Katharine Look hall Disposition Plan: back to [facility]  Consultants:   Critical care Procedures:   EEG Antimicrobials:  Anti-infectives (From admission, onward)   Start     Dose/Rate Route Frequency Ordered Stop   02/26/18 1530  doxycycline (VIBRA-TABS)  tablet 100 mg     100 mg Per Tube Every 12 hours 02/26/18 1517     02/24/18 0600  vancomycin (VANCOCIN) 500 mg in sodium chloride 0.9 % 100 mL IVPB  Status:  Discontinued     500 mg 100 mL/hr over 60 Minutes Intravenous Every 24 hours 02/24/18 0051 02/26/18 0807   02/24/18 0200  aztreonam (AZACTAM) 1 g in sodium chloride 0.9 % 100 mL IVPB  Status:   Discontinued     1 g 200 mL/hr over 30 Minutes Intravenous Every 8 hours 02/24/18 0149 02/26/18 0807   02/24/18 0130  ceFEPIme (MAXIPIME) 1 g in sodium chloride 0.9 % 100 mL IVPB  Status:  Discontinued     1 g 200 mL/hr over 30 Minutes Intravenous Every 12 hours 02/24/18 0051 02/24/18 0144       Objective: Vitals:   02/27/18 0007 02/27/18 0700 02/27/18 0834 02/27/18 1200  BP: (!) 118/53  123/70 124/72  Pulse: 74  67 79  Resp: 18  (!) 21 18  Temp: 98.1 F (36.7 C)  (!) 97.5 F (36.4 C) 97.9 F (36.6 C)  TempSrc: Oral  Oral Oral  SpO2: 93%  94% 95%  Weight:  84.8 kg (186 lb 15.2 oz)    Height:        Intake/Output Summary (Last 24 hours) at 02/27/2018 1402 Last data filed at 02/27/2018 0900 Gross per 24 hour  Intake 1647.34 ml  Output -  Net 1647.34 ml   Filed Weights   02/25/18 1902 02/26/18 0623 02/27/18 0700  Weight: 77.2 kg (170 lb 3.1 oz) 77.7 kg (171 lb 4.8 oz) 84.8 kg (186 lb 15.2 oz)    Examination: General exam: Appears comfortable - asleep HEENT: PERRLA, oral mucosa moist, no sclera icterus or thrush Respiratory system: Clear to auscultation. Respiratory effort normal. Cardiovascular system: S1 & S2 heard, RRR.  No murmurs  Gastrointestinal system: Abdomen soft, non-tender, nondistended. Normal bowel sound. No organomegaly Extremities: No cyanosis, clubbing or edema Skin: No rashes or ulcers   Data Reviewed: I have personally reviewed following labs and imaging studies  CBC: Recent Labs  Lab 02/23/18 2149 02/24/18 0915 02/25/18 0610 02/26/18 0424  WBC 16.4* 16.1* 7.8 7.7  NEUTROABS 15.6*  --   --   --   HGB 12.9 11.6* 9.9* 10.0*  HCT 38.8 35.4* 28.8* 29.6*  MCV 94.6 95.7 94.7 94.6  PLT 188 230 153 983   Basic Metabolic Panel: Recent Labs  Lab 02/23/18 2149 02/24/18 0215 02/25/18 0610 02/27/18 0940  NA 140 139 141  --   K 3.2* 3.4* 3.6  --   CL 109 111 112*  --   CO2 18* 19* 20*  --   GLUCOSE 203* 183* 151*  --   BUN 40* 36* 36*  --    CREATININE 0.71 0.64 0.52  --   CALCIUM 7.4* 7.4* 7.8*  --   MG 1.7 2.2 2.2  --   PHOS 1.9*  --  1.3* 1.9*   GFR: Estimated Creatinine Clearance: 77.1 mL/min (by C-G formula based on SCr of 0.52 mg/dL). Liver Function Tests: No results for input(s): AST, ALT, ALKPHOS, BILITOT, PROT, ALBUMIN in the last 168 hours. No results for input(s): LIPASE, AMYLASE in the last 168 hours. No results for input(s): AMMONIA in the last 168 hours. Coagulation Profile: No results for input(s): INR, PROTIME in the last 168 hours. Cardiac Enzymes: Recent Labs  Lab 02/23/18 2149  TROPONINI 0.04*   BNP (last 3 results)  No results for input(s): PROBNP in the last 8760 hours. HbA1C: No results for input(s): HGBA1C in the last 72 hours. CBG: Recent Labs  Lab 02/26/18 1122 02/26/18 1646 02/26/18 2024 02/27/18 0007 02/27/18 0502  GLUCAP 117* 124* 123* 167* 151*   Lipid Profile: No results for input(s): CHOL, HDL, LDLCALC, TRIG, CHOLHDL, LDLDIRECT in the last 72 hours. Thyroid Function Tests: No results for input(s): TSH, T4TOTAL, FREET4, T3FREE, THYROIDAB in the last 72 hours. Anemia Panel: No results for input(s): VITAMINB12, FOLATE, FERRITIN, TIBC, IRON, RETICCTPCT in the last 72 hours. Urine analysis:    Component Value Date/Time   COLORURINE YELLOW 02/23/2018 2150   APPEARANCEUR HAZY (A) 02/23/2018 2150   LABSPEC 1.030 02/23/2018 2150   PHURINE 5.0 02/23/2018 2150   GLUCOSEU 50 (A) 02/23/2018 2150   HGBUR SMALL (A) 02/23/2018 2150   BILIRUBINUR NEGATIVE 02/23/2018 2150   KETONESUR NEGATIVE 02/23/2018 2150   PROTEINUR 30 (A) 02/23/2018 2150   NITRITE NEGATIVE 02/23/2018 2150   LEUKOCYTESUR NEGATIVE 02/23/2018 2150   Sepsis Labs: @LABRCNTIP (procalcitonin:4,lacticidven:4) ) Recent Results (from the past 240 hour(s))  MRSA PCR Screening     Status: None   Collection Time: 02/23/18  8:58 PM  Result Value Ref Range Status   MRSA by PCR NEGATIVE NEGATIVE Final    Comment:         The GeneXpert MRSA Assay (FDA approved for NASAL specimens only), is one component of a comprehensive MRSA colonization surveillance program. It is not intended to diagnose MRSA infection nor to guide or monitor treatment for MRSA infections. Performed at [facility], [place] [address] [place], [place] [address]   Culture, blood (routine x 2)     Status: None (Preliminary result)   Collection Time: 02/23/18 10:40 PM  Result Value Ref Range Status   Specimen Description BLOOD LEFT HAND  Final   Special Requests   Final    BOTTLES DRAWN AEROBIC AND ANAEROBIC Blood Culture adequate volume   Culture   Final    NO GROWTH 4 DAYS Performed at [facility], [place] [address], [place], [place] [address]    Report Status PENDING  Incomplete  Culture, blood (routine x 2)     Status: None (Preliminary result)   Collection Time: 02/23/18 10:45 PM  Result Value Ref Range Status   Specimen Description BLOOD LEFT HAND  Final   Special Requests   Final    BOTTLES DRAWN AEROBIC AND ANAEROBIC Blood Culture results may not be optimal due to an inadequate volume of blood received in culture bottles   Culture   Final    NO GROWTH 4 DAYS Performed at [facility], [place] [address] [place], [place] [address]    Report Status PENDING  Incomplete         Radiology Studies: Ct Head Wo Contrast  Result Date: 02/25/2018 CLINICAL DATA:  Mental status changes EXAM: CT HEAD WITHOUT CONTRAST TECHNIQUE: Contiguous axial images were obtained from the base of the skull through the vertex without intravenous contrast. COMPARISON:  01/06/2018 FINDINGS: Brain: Global atrophy. There are chronic ischemic changes in the periventricular white matter. There is no mass effect, midline shift, or acute hemorrhage. Vascular: No hyperdense vessel or unexpected calcification. Skull: Cranium is intact. Sinuses/Orbits: Mastoid air cells are clear. Visualized paranasal sinuses are clear. Orbits are unremarkable.  Other: Noncontributory IMPRESSION: Chronic changes.  No acute intracranial pathology. Electronically Signed   By: Marybelle Killings M.D.   On: 02/25/2018 18:32  Scheduled Meds: . amiodarone  400 mg Per Tube BID  . docusate  100 mg Per Tube BID  . doxycycline  100 mg Per Tube Q12H  . escitalopram  10 mg Per Tube QHS  . famotidine  20 mg Per Tube BID  . feeding supplement (OSMOLITE 1.2 CAL)  1,000 mL Per Tube Q24H  . hydrocortisone cream   Topical BID  . levothyroxine  112 mcg Per Tube QAC breakfast  . mouth rinse  15 mL Mouth Rinse BID  . PHOSPHA 250 NEUTRAL  500 mg Per Tube TID WC & HS  . [START ON 02/28/2018] predniSONE  20 mg Per Tube Q breakfast  . rivaroxaban  20 mg Oral Q supper   Continuous Infusions: . sodium chloride 10 mL/hr at 02/25/18 1900     LOS: 4 days    Time spent in minutes: 35 min    Debbe Odea, MD [facility] Pager: [email or web address] Password [email or web address] 02/27/2018, 2:02 PM

## 2018-02-27 NOTE — Progress Notes (Addendum)
OT Cancellation Note  Patient Details Name: Paige Murphy MRN: [id number] DOB: Aug 24, 1946   Cancelled Treatment:    Reason Eval/Treat Not Completed: Pt Not appropriate for acute therapy. Pt is long term SNF resident, wc/bedbound and non-verbal. OT will sign off at this time.    Merri Ray Palyn Scrima 02/27/2018, 9:06 AM  Hulda Humphrey OTR/L [phone]

## 2018-02-27 NOTE — Progress Notes (Signed)
PT Cancellation Note  Patient Details Name: Paige Murphy MRN: [id number] DOB: [birth year]   Cancelled Treatment:    Reason Eval/Treat Not Completed: PT screened, no needs identified, will sign off. Pt bed bound/wc bound at SNF and nonverbal. Not appropriate for acute PT.   Shary Decamp Maycok 02/27/2018, 10:47 AM [place]

## 2018-02-27 NOTE — Progress Notes (Signed)
Pt is very impulsive this morning. She is refusing blood draw and her sister is at bed side. The patient and her sister were educated but will need to check on her later when she calms down

## 2018-02-28 DIAGNOSIS — E876 Hypokalemia: Secondary | ICD-10-CM

## 2018-02-28 LAB — BASIC METABOLIC PANEL
Anion gap: 7 (ref 5–15)
Anion gap: 6 (ref 5–15)
BUN: 23 mg/dL — AB (ref 6–20)
BUN: 18 mg/dL (ref 6–20)
CALCIUM: 7.5 mg/dL — AB (ref 8.9–10.3)
CO2: 23 mmol/L (ref 22–32)
CO2: 24 mmol/L (ref 22–32)
CREATININE: 0.46 mg/dL (ref 0.44–1.00)
CREATININE: 0.5 mg/dL (ref 0.44–1.00)
Calcium: 7.5 mg/dL — ABNORMAL LOW (ref 8.9–10.3)
Chloride: 111 mmol/L (ref 101–111)
Chloride: 109 mmol/L (ref 101–111)
GFR calc non Af Amer: >60 mL/min (ref >60)
Glucose, Bld: 107 mg/dL — ABNORMAL HIGH (ref 65–99)
Glucose, Bld: 120 mg/dL — ABNORMAL HIGH (ref 65–99)
POTASSIUM: 3.3 mmol/L — AB (ref 3.5–5.1)
Potassium: 2.9 mmol/L — ABNORMAL LOW (ref 3.5–5.1)
SODIUM: 141 mmol/L (ref 135–145)
SODIUM: 139 mmol/L (ref 135–145)

## 2018-02-28 LAB — GLUCOSE, CAPILLARY
GLUCOSE-CAPILLARY: 131 mg/dL — AB (ref 65–99)
GLUCOSE-CAPILLARY: 142 mg/dL — AB (ref 65–99)
Glucose-Capillary: 116 mg/dL — ABNORMAL HIGH (ref 65–99)
Glucose-Capillary: 129 mg/dL — ABNORMAL HIGH (ref 65–99)
Glucose-Capillary: 138 mg/dL — ABNORMAL HIGH (ref 65–99)

## 2018-02-28 LAB — CULTURE, BLOOD (ROUTINE X 2)
CULTURE: NO GROWTH
Culture: NO GROWTH
Special Requests: ADEQUATE

## 2018-02-28 LAB — PHOSPHORUS
PHOSPHORUS: 1.8 mg/dL — AB (ref 2.5–4.6)
Phosphorus: 3.3 mg/dL (ref 2.5–4.6)

## 2018-02-28 LAB — MAGNESIUM: MAGNESIUM: 1.8 mg/dL (ref 1.7–2.4)

## 2018-02-28 MED ORDER — POTASSIUM CHLORIDE 20 MEQ/15ML (10%) PO SOLN
40.0000 meq | ORAL | Status: AC
  Administered 2018-02-28 (×2): 40 meq via ORAL
  Filled 2018-02-28 (×2): qty 30

## 2018-02-28 MED ORDER — PHENOL 1.4 % MT LIQD
1.0000 | OROMUCOSAL | Status: DC | PRN
  Administered 2018-02-28: 1 via OROMUCOSAL
  Filled 2018-02-28: qty 177

## 2018-02-28 MED ORDER — K PHOS MONO-SOD PHOS DI & MONO 155-852-130 MG PO TABS
500.0000 mg | ORAL_TABLET | Freq: Three times a day (TID) | ORAL | Status: DC
  Filled 2018-02-28 (×2): qty 2

## 2018-02-28 MED ORDER — K PHOS MONO-SOD PHOS DI & MONO 155-852-130 MG PO TABS
500.0000 mg | ORAL_TABLET | Freq: Three times a day (TID) | ORAL | Status: DC
  Administered 2018-02-28: 500 mg
  Filled 2018-02-28 (×2): qty 2

## 2018-02-28 MED ORDER — POTASSIUM PHOSPHATES 15 MMOLE/5ML IV SOLN
30.0000 mmol | Freq: Once | INTRAVENOUS | Status: AC
  Administered 2018-02-28: 30 mmol via INTRAVENOUS
  Filled 2018-02-28: qty 10

## 2018-02-28 MED ORDER — OSMOLITE 1.2 CAL PO LIQD
1000.0000 mL | ORAL | Status: DC
  Administered 2018-02-28 – 2018-03-01 (×2): 1000 mL
  Filled 2018-02-28 (×4): qty 1000

## 2018-02-28 NOTE — Progress Notes (Signed)
PROGRESS NOTE    [name]   [id number]  DOB: 31-Dec-1945  DOA: 02/23/2018 PCP: Rosalee Kaufman, PA-C   Brief Narrative:  Paige Murphy 72 yr old female from the [facility] in [place] with  Hypothyroidism, Breast CA s/p partial mastectomy and radiation,  compression fracture s/p sx, Afib, CVA on baby aspirin, Progressive Supranuclear palsy (nonverbal, mostly wheelchair bound) PEG tube & indwelling foley. She presented to [facility] ED with fevers that started on 02/20/18 and vomiting. She was worked up for infection and received 3L bolus NS and Vancomycin,Clindamycin and Zosyn. She has an allergy to Zosyn and developed a rash and possible anaphylaxis per their documentation.  She received Adrenaline, Methylpred 125mg , Diphenydramine. She also was noted to be in Afib RVR, attempted cardioversion x 3, and received Cardizem. BP was noted to subsequently drop. Transferred to [facility] ICU. On presentation to [facility] ICU, BP was in 90s- she was on Neosynephrine.  The suspicion was aspiration pneumonia.  Subjective: Much more alert and back to baseline per sister. No other complaints. patient is non verbal at this time.  Assessment & Plan:   Principal Problem:   Sepsis  - presented with fever, leukocytosis and vomiting- ICU team suspected aspiration pneumonia - initial CXR clear, chronic foley> UA rare bacteria and 6-30 WBC, neg leukocytes and nitrites (unclear if UTI) - given antibiotics broad spectrum antibiotics - now on Doxy which was started on 4/2- 7 days will be complete on 4/6  Active Problems:   Anaphylactic reaction/ circulatory shock/ Maculopapular rash  - occurred after Zosyn in ED- per sister she was on antibiotics for cellulitis as well as outpt   - treated with steroids, Benadryl, Pepcid, pressors - 4/4- currently still on Benadryl BID, Pepcid BID and Prednisone which will be stopped - 4/5 quite stable after above stopped- no recurrence of symptoms  Lethargy/  agitation- acute metabolic encephalopathy - currently mostly sleepy but in between becomes combative- not her baseline per sister - EEG shows slowing - 4/3- stop BID IV Benadryl and steroids and follow for improvement - Addendum: per RN, she smiled at a visitor, is no longer combative - 4/4 alert and back to baseline now     PSP (progressive supranuclear palsy) - per sister, able to speak after she awakens- mostly bed bound   Locked jaw - recent PEG- Protein calorie malnutrition with weight loss. -recent PEG on 3/22-  tube feeds at goal-    Chronic foley - cont chronic foley and resume Ditropan per home dose (resumed today)  PAF with RVR on admission S/P  failed cardioversion (2 shocks) in ER - on per tube Amio and xarelto now - in sinus rhythm  QTC > 500 - hold QTc prolonging meds- follow labs - awaiting  Labs - QTc 487 on EKG today  Hypophosphatemia/ hypokalemia- ? Refeeding syndrome - replacing   Depression - on Lexapro at home      DVT prophylaxis: Xarelto Code Status: DNR Family Communication: sister Katharine Look hall Disposition Plan: back to [place] center - cannot d/c as K and  Phos are low Consultants:   Critical care Procedures:   EEG Antimicrobials:  Anti-infectives (From admission, onward)   Start     Dose/Rate Route Frequency Ordered Stop   02/26/18 1530  doxycycline (VIBRA-TABS) tablet 100 mg     100 mg Per Tube Every 12 hours 02/26/18 1517     02/24/18 0600  vancomycin (VANCOCIN) 500 mg in sodium chloride 0.9 % 100 mL IVPB  Status:  Discontinued     500 mg 100 mL/hr over 60 Minutes Intravenous Every 24 hours 02/24/18 0051 02/26/18 0807   02/24/18 0200  aztreonam (AZACTAM) 1 g in sodium chloride 0.9 % 100 mL IVPB  Status:  Discontinued     1 g 200 mL/hr over 30 Minutes Intravenous Every 8 hours 02/24/18 0149 02/26/18 0807   02/24/18 0130  ceFEPIme (MAXIPIME) 1 g in sodium chloride 0.9 % 100 mL IVPB  Status:  Discontinued     1 g 200 mL/hr over 30 Minutes  Intravenous Every 12 hours 02/24/18 0051 02/24/18 0144       Objective: Vitals:   02/27/18 2015 02/27/18 2249 02/28/18 0427 02/28/18 1215  BP: (!) 103/53 136/76 (!) 106/56 116/68  Pulse: (!) 59 62 65 65  Resp: 17  18   Temp: 98 F (36.7 C)  (!) 97.5 F (36.4 C) 98.3 F (36.8 C)  TempSrc: Oral  Oral Oral  SpO2: 96%  93% 93%  Weight:   81.8 kg (180 lb 5.4 oz)   Height:        Intake/Output Summary (Last 24 hours) at 02/28/2018 1241 Last data filed at 02/28/2018 0636 Gross per 24 hour  Intake 1380.45 ml  Output 900 ml  Net 480.45 ml   Filed Weights   02/26/18 0623 02/27/18 0700 02/28/18 0427  Weight: 77.7 kg (171 lb 4.8 oz) 84.8 kg (186 lb 15.2 oz) 81.8 kg (180 lb 5.4 oz)    Examination: General exam: alert, comfortable HEENT: PERRLA, oral mucosa moist, no sclera icterus or thrush Respiratory system: Clear to auscultation. Respiratory effort normal. Cardiovascular system: S1 & S2 heard, RRR.    Gastrointestinal system: Abdomen soft, non-tender, nondistended. Normal bowel sound. PEG intact Extremities: No cyanosis, clubbing or edema Skin: No rashes or ulcers   Data Reviewed: I have personally reviewed following labs and imaging studies  CBC: Recent Labs  Lab 02/23/18 2149 02/24/18 0915 02/25/18 0610 02/26/18 0424  WBC 16.4* 16.1* 7.8 7.7  NEUTROABS 15.6*  --   --   --   HGB 12.9 11.6* 9.9* 10.0*  HCT 38.8 35.4* 28.8* 29.6*  MCV 94.6 95.7 94.7 94.6  PLT 188 230 153 025   Basic Metabolic Panel: Recent Labs  Lab 02/23/18 2149 02/24/18 0215 02/25/18 0610 02/27/18 0940 02/27/18 1441 02/28/18 0459  NA 140 139 141  --  141 141  K 3.2* 3.4* 3.6  --  3.1* 2.9*  CL 109 111 112*  --  113* 111  CO2 18* 19* 20*  --  21* 23  GLUCOSE 203* 183* 151*  --  188* 107*  BUN 40* 36* 36*  --  22* 23*  CREATININE 0.71 0.64 0.52  --  0.48 0.46  CALCIUM 7.4* 7.4* 7.8*  --  7.6* 7.5*  MG 1.7 2.2 2.2  --  1.8 1.8  PHOS 1.9*  --  1.3* 1.9*  --  1.8*   GFR: Estimated  Creatinine Clearance: 75.9 mL/min (by C-G formula based on SCr of 0.46 mg/dL). Liver Function Tests: No results for input(s): AST, ALT, ALKPHOS, BILITOT, PROT, ALBUMIN in the last 168 hours. No results for input(s): LIPASE, AMYLASE in the last 168 hours. No results for input(s): AMMONIA in the last 168 hours. Coagulation Profile: No results for input(s): INR, PROTIME in the last 168 hours. Cardiac Enzymes: Recent Labs  Lab 02/23/18 2149  TROPONINI 0.04*   BNP (last 3 results) No results for input(s): PROBNP in the last 8760 hours. HbA1C:  No results for input(s): HGBA1C in the last 72 hours. CBG: Recent Labs  Lab 02/27/18 2011 02/28/18 0003 02/28/18 0424 02/28/18 0759 02/28/18 1212  GLUCAP 150* 138* 116* 131* 142*   Lipid Profile: No results for input(s): CHOL, HDL, LDLCALC, TRIG, CHOLHDL, LDLDIRECT in the last 72 hours. Thyroid Function Tests: No results for input(s): TSH, T4TOTAL, FREET4, T3FREE, THYROIDAB in the last 72 hours. Anemia Panel: No results for input(s): VITAMINB12, FOLATE, FERRITIN, TIBC, IRON, RETICCTPCT in the last 72 hours. Urine analysis:    Component Value Date/Time   COLORURINE YELLOW 02/23/2018 2150   APPEARANCEUR HAZY (A) 02/23/2018 2150   LABSPEC 1.030 02/23/2018 2150   PHURINE 5.0 02/23/2018 2150   GLUCOSEU 50 (A) 02/23/2018 2150   HGBUR SMALL (A) 02/23/2018 2150   BILIRUBINUR NEGATIVE 02/23/2018 2150   KETONESUR NEGATIVE 02/23/2018 2150   PROTEINUR 30 (A) 02/23/2018 2150   NITRITE NEGATIVE 02/23/2018 2150   LEUKOCYTESUR NEGATIVE 02/23/2018 2150   Sepsis Labs: @LABRCNTIP (procalcitonin:4,lacticidven:4) ) Recent Results (from the past 240 hour(s))  MRSA PCR Screening     Status: None   Collection Time: 02/23/18  8:58 PM  Result Value Ref Range Status   MRSA by PCR NEGATIVE NEGATIVE Final    Comment:        The GeneXpert MRSA Assay (FDA approved for NASAL specimens only), is one component of a comprehensive MRSA  colonization surveillance program. It is not intended to diagnose MRSA infection nor to guide or monitor treatment for MRSA infections. Performed at [facility], [place] [address] [place], [place] [address]   Culture, blood (routine x 2)     Status: None   Collection Time: 02/23/18 10:40 PM  Result Value Ref Range Status   Specimen Description BLOOD LEFT HAND  Final   Special Requests   Final    BOTTLES DRAWN AEROBIC AND ANAEROBIC Blood Culture adequate volume   Culture   Final    NO GROWTH 5 DAYS Performed at [facility], [place] [address] [place], [place] [address]    Report Status 02/28/2018 FINAL  Final  Culture, blood (routine x 2)     Status: None   Collection Time: 02/23/18 10:45 PM  Result Value Ref Range Status   Specimen Description BLOOD LEFT HAND  Final   Special Requests   Final    BOTTLES DRAWN AEROBIC AND ANAEROBIC Blood Culture results may not be optimal due to an inadequate volume of blood received in culture bottles   Culture   Final    NO GROWTH 5 DAYS Performed at [facility], [place] [address] [place], [place] [address]    Report Status 02/28/2018 FINAL  Final         Radiology Studies: No results found.    Scheduled Meds: . amiodarone  400 mg Per Tube BID  . docusate  100 mg Per Tube BID  . doxycycline  100 mg Per Tube Q12H  . escitalopram  10 mg Per Tube QHS  . hydrocortisone cream   Topical BID  . levothyroxine  112 mcg Per Tube QAC breakfast  . mouth rinse  15 mL Mouth Rinse BID  . oxybutynin  5 mg Per Tube TID  . PHOSPHA 250 NEUTRAL  500 mg Per Tube TID WC & HS  . potassium chloride  40 mEq Oral Q4H  . rivaroxaban  20 mg Oral Q supper   Continuous Infusions: . sodium chloride 10 mL/hr at 02/25/18 1900  . feeding supplement (OSMOLITE  1.2 CAL) 1,000 mL (02/28/18 0513)  . potassium PHOSPHATE IVPB (mmol) 30 mmol (02/28/18 1011)     LOS: 5 days    Time spent in minutes: 35 min    Debbe Odea, MD [facility] Pager: [email or web address] Password [facility] 02/28/2018, 12:41 PM

## 2018-02-28 NOTE — Progress Notes (Signed)
Patient family called-  Patient seemed uncomfortable.  Patient pointing at base of throat Ask patient if having chest pain or throat pain.   Nodded with throat pain and when ask if having pain while swallowing.     Paged Dr.  Doctor ordered chloraseptic spray

## 2018-02-28 NOTE — Progress Notes (Addendum)
Nutrition Follow-up  DOCUMENTATION CODES:   Not applicable  INTERVENTION:   Continue Osmolite 1.2 @ 55 ml/hr via PEG  Tube feeding regimen provides 1584 kcal (100% of needs), 73 grams of protein, and 1082 ml of H2O.   NUTRITION DIAGNOSIS:   Inadequate oral intake related to inability to eat as evidenced by NPO status.  Ongoing  GOAL:   Patient will meet greater than or equal to 90% of their needs  Met with TF  MONITOR:   Supplement acceptance, Labs, Weight trends, Diet advancement, TF tolerance, PO intake, I & O's  REASON FOR ASSESSMENT:   Consult Enteral/tube feeding initiation and management  ASSESSMENT:   Patient with PMH significant for Progressive Supranuclear Palsy with lock jaw/dysphagia, severe malnutrition, s/p PEG, and depression. Presents this admission with allergic reaction secondary to zosyn administration and A fib with RVR s/p failed cardioversion.   4/1- transferred from ICU to floor   Case discussed with nurse tech, who reports pt is calm, but sleepy. Pt is about to be given a bath.   Pt sleeping soundly at time of visit. Spoke with sister at bedside, who reports pt is tolerating TF well, however, pt is upset that she is NPO. Pt typically consumes sips of water for comfort. Per sister, pt's mental status has decreased since admission.   Osmolite 1.2 infusing via PEG @ 55 ml/hr. Per RN, pt tolerating well. Regimen provides 1584 kcals, 73 grams protein, and 1082 ml free water, which meets 100% of pt's nutritional needs.  Labs reviewed: K: 2.9 (on supplementation), Phos: 1.8 (on supplementation), CBGS: 116-131.   Diet Order:  No diet orders on file  EDUCATION NEEDS:   Not appropriate for education at this time  Skin:  Skin Assessment: Skin Integrity Issues: Skin Integrity Issues:: Stage I Stage I: sacrum  Last BM:  02/27/18  Height:   Ht Readings from Last 1 Encounters:  02/25/18 5' 9"  (1.753 m)    Weight:   Wt Readings from Last 1  Encounters:  02/28/18 180 lb 5.4 oz (81.8 kg)    Ideal Body Weight:  61.3 kg  BMI:  Body mass index is 26.63 kg/m.  Estimated Nutritional Needs:   Kcal:  1450-1650 kcal  Protein:  70-80 g  Fluid:  >1.4 L/day    Sarely Stracener A. Jimmye Norman, RD, LDN, CDE Pager: [phone] After hours Pager: [phone]

## 2018-03-01 LAB — BASIC METABOLIC PANEL
ANION GAP: 9 (ref 5–15)
BUN: 12 mg/dL (ref 6–20)
CO2: 23 mmol/L (ref 22–32)
Calcium: 8.1 mg/dL — ABNORMAL LOW (ref 8.9–10.3)
Chloride: 103 mmol/L (ref 101–111)
Creatinine, Ser: 0.43 mg/dL — ABNORMAL LOW (ref 0.44–1.00)
GFR calc Af Amer: >60 mL/min (ref >60)
GLUCOSE: 130 mg/dL — AB (ref 65–99)
POTASSIUM: 3.9 mmol/L (ref 3.5–5.1)
Sodium: 135 mmol/L (ref 135–145)

## 2018-03-01 LAB — GLUCOSE, CAPILLARY
GLUCOSE-CAPILLARY: 125 mg/dL — AB (ref 65–99)
Glucose-Capillary: 109 mg/dL — ABNORMAL HIGH (ref 65–99)
Glucose-Capillary: 139 mg/dL — ABNORMAL HIGH (ref 65–99)
Glucose-Capillary: 113 mg/dL — ABNORMAL HIGH (ref 65–99)

## 2018-03-01 LAB — PHOSPHORUS: PHOSPHORUS: 4 mg/dL (ref 2.5–4.6)

## 2018-03-01 LAB — CBC
HCT: 36.1 % (ref 36.0–46.0)
Hemoglobin: 11.9 g/dL — ABNORMAL LOW (ref 12.0–15.0)
MCH: 30.7 pg (ref 26.0–34.0)
MCHC: 33 g/dL (ref 30.0–36.0)
MCV: 93 fL (ref 78.0–100.0)
Platelets: 260 10*3/uL (ref 150–400)
RBC: 3.88 MIL/uL (ref 3.87–5.11)
RDW: 13.2 % (ref 11.5–15.5)
WBC: 10.9 10*3/uL — AB (ref 4.0–10.5)

## 2018-03-01 LAB — MAGNESIUM: Magnesium: 1.8 mg/dL (ref 1.7–2.4)

## 2018-03-01 MED ORDER — LOPERAMIDE HCL 2 MG PO CAPS: 2.0000 mg | ORAL_CAPSULE | ORAL | Status: DC | PRN

## 2018-03-01 MED ORDER — LOPERAMIDE HCL 1 MG/5ML PO LIQD
2.0000 mg | Freq: Two times a day (BID) | ORAL | Status: DC | PRN
  Administered 2018-03-01: 2 mg via ORAL
  Filled 2018-03-01 (×2): qty 10

## 2018-03-01 MED ORDER — LOPERAMIDE HCL 2 MG PO TABS: 2.0000 mg | ORAL_TABLET | Freq: Four times a day (QID) | ORAL | 0 refills | Status: AC | PRN

## 2018-03-01 MED ORDER — FREE WATER: 200.0000 mL | Freq: Three times a day (TID) | Status: AC

## 2018-03-01 MED ORDER — DOXYCYCLINE HYCLATE 100 MG PO TABS: 100.0000 mg | ORAL_TABLET | Freq: Two times a day (BID) | ORAL | Status: DC

## 2018-03-01 MED ORDER — OSMOLITE 1.2 CAL PO LIQD: ORAL | 0 refills | Status: DC

## 2018-03-01 MED ORDER — PHENOL 1.4 % MT LIQD: 1.0000 | OROMUCOSAL | 0 refills | Status: AC | PRN

## 2018-03-01 MED ORDER — POTASSIUM CHLORIDE 20 MEQ/15ML (10%) PO SOLN
40.0000 meq | Freq: Once | ORAL | Status: AC
Start: 2018-03-01 — End: 2018-03-01
  Administered 2018-03-01: 40 meq via ORAL
  Filled 2018-03-01: qty 30

## 2018-03-01 MED ORDER — RIVAROXABAN 20 MG PO TABS: 20.0000 mg | ORAL_TABLET | Freq: Every day | ORAL | Status: AC

## 2018-03-01 MED ORDER — AMIODARONE HCL 400 MG PO TABS: 400.0000 mg | ORAL_TABLET | Freq: Two times a day (BID) | ORAL | Status: AC

## 2018-03-01 NOTE — Discharge Summary (Addendum)
Physician Discharge Summary  Paige Murphy ZTI:[id number] DOB: 09-18-1946 DOA: 02/23/2018  PCP: Rosalee Kaufman, PA-C  Admit date: 02/23/2018 Discharge date: 03/01/2018  [facility] Disposition:  SNF   Recommendations for Outpatient Follow-up:  1. Please stop Doxycycline on 4/6 and then monitor closely.  2. Nutritionist to see patient at facility please for PEG feeds 3. Bmet tomorrow- replace electrolytes  4. Follow diarrhea please ADDENDUM: I have been notified that the patient was on Jevity 1.2 at the facility- we have started Osmolite which they state they do not have- she can resume Jevity at the rate she was previously on- Nutritionist needs to see her and make recommendations    Discharge Condition:  stable   CODE STATUS:  DNR   Diet recommendation:  Tube feeds- see below Consultations:  none    Discharge Diagnoses:  Principal Problem:   Sepsis ([place]) Active Problems:   A-fib with RVR   Shock circulatory (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 She presented to [facility] ED with fevers that started on 02/20/18 and vomiting. She was worked up for infection and received 3L bolus NS and Vancomycin,Clindamycin and Zosyn. She has an allergy to Zosyn and developed a rash and possible anaphylaxis per their documentation. She received Adrenaline, Methylpred 125mg , Diphenydramine. She also was noted to be in Afib RVR, attempted cardioversion x 3, and received Cardizem.  BP was noted to subsequently drop. Transferred to [facility] ICU. On presentation to [facility] ICU, BP was in 90s- she was on Neosynephrine.  The suspicion was aspiration pneumonia.  Subjective: Much more alert and back to baseline per sister (for 2 days now).  Patient was able to speak one word to me this morning. Had 3 loose stools last night. No fever or abdominal pain and therefore no need to check for C diff. Resolved with one dose of Imodium given by night doc.    Hospital Course:    Principal Problem:   Sepsis  - presented with fever, leukocytosis and vomiting- ICU team suspected aspiration pneumonia - initial CXR clear - chronic foley> UA rare bacteria and 6-30 WBC, neg leukocytes and nitrites (unclear if UTI) - blood cultures neg- no U culture done - given antibiotics broad spectrum antibiotics - now on Doxy which was started on 4/2- 7 days will be complete on 4/6  Active Problems:   Anaphylactic reaction/ circulatory shock/ Maculopapular rash  - occurred after Zosyn in ED- per sister she was on antibiotics for cellulitis as well as outpt   - treated with steroids, Benadryl, Pepcid, pressors - 4/4- currently still on Benadryl BID, Pepcid BID and Prednisone which will be stopped - 4/5 quite stable after above stopped- no recurrence of symptoms  Lethargy/ agitation- acute metabolic encephalopathy - currently mostly sleepy but in between becomes combative- not her baseline per sister - EEG shows slowing - 4/3- stop BID IV Benadryl and steroids and follow for improvement - 4/4 alert and back to baseline now - continues to be very alert- is speaking a  little this AM     PSP (progressive supranuclear palsy) - per sister, able to speak after she awakens- mostly bed bound   Locked jaw- recent PEG- Protein calorie malnutrition with weight loss. -recent PEG on 3/22-  tube feeds at goal-    Chronic foley - cont chronic foley and resume Ditropan per home dose   PAF with RVR on  admission due to acute medical issues above S/P failedcardioversion (2 shocks) in ER - on per tube Amio and xarelto  - in sinus rhythm for now  QTC > 500 - hold QTc prolonging meds-  Corrected electrolytes - 4/4- QTc 487 on EKG    Hypophosphatemia/ hypokalemia- ? Refeeding syndrome - replacing - has resolved  Depression - on Lexapro at home  Loose stools/ diarrhea - last night- hold Colace- PRN Imodium     Discharge Exam: Vitals:   03/01/18 0414 03/01/18 0849  BP: 110/71 101/60  Pulse: 60 65  Resp: 18   Temp: 97.6 F (36.4 C)   SpO2: 95%    Vitals:   02/28/18 1215 02/28/18 2042 03/01/18 0414 03/01/18 0849  BP: 116/68 114/70 110/71 101/60  Pulse: 65 60 60 65  Resp:   18   Temp: 98.3 F (36.8 C) (!) 97.5 F (36.4 C) 97.6 F (36.4 C)   TempSrc: Oral Oral Oral   SpO2: 93% 96% 95%   Weight:   79.8 kg (176 lb)   Height:        General: Pt is alert, awake, not in acute distress Cardiovascular: RRR, S1/S2 +  Respiratory: CTA bilaterally, no wheezing, no rhonchi Abdominal: Soft, NT, ND, bowel sounds +, PEG intact Extremities: no edema, no cyanosis   Discharge Instructions   Allergies as of 03/01/2018      Reactions   Zosyn [piperacillin Sod-tazobactam So] Anaphylaxis      Medication List    STOP taking these medications   docusate 50 MG/5ML liquid Commonly known as:  COLACE     TAKE these medications   acetaminophen 500 MG tablet Commonly known as:  TYLENOL Place 1,000 mg into feeding tube every 6 (six) hours as needed for fever (pain/ temp >101).   amiodarone 400 MG tablet Commonly known as:  PACERONE Place 1 tablet (400 mg total) into feeding tube 2 (two) times daily.   aspirin 81 MG tablet Place 81 mg into feeding tube daily.   doxycycline 100 MG tablet Commonly known as:  VIBRA-TABS Place 1 tablet (100 mg total) into feeding tube every 12 (twelve) hours.   escitalopram 10 MG tablet Commonly known as:  LEXAPRO Place 10 mg into feeding  tube at bedtime.   free water Soln Place 200 mLs into feeding tube every 8 (eight) hours.   levothyroxine 112 MCG tablet Commonly known as:  SYNTHROID, LEVOTHROID Place 112 mcg into feeding tube daily before breakfast.   loperamide 2 MG tablet Commonly known as:  IMODIUM A-D Take 1 tablet (2 mg total) by mouth 4 (four) times daily as needed for diarrhea or loose stools.   NUTRITIONAL SUPPLEMENTS PO Take 1 each by mouth 2 (two) times daily before lunch and supper. Commercial supplement "mighty shake" What changed:  Another medication with the same name was added. Make sure you understand how and when to take each.   PROMOD Liqd Place 39 mLs into feeding tube 2 (two) times daily. What changed:  Another medication with the same name was added. Make sure you understand how and when to take each.  NUTRITIONAL SUPPLEMENT PO Place 180 mLs into feeding tube 4 (four) times daily. House 2.0 /med pass What changed:  Another medication with the same name was added. Make sure you understand how and when to take each.   feeding supplement (OSMOLITE 1.2 CAL) Liqd 55 cc/hr per tube What changed:  You were already taking a medication with the same name, and this prescription was added. Make sure you understand how and when to take each.   oxybutynin 5 MG tablet Commonly known as:  DITROPAN Place 5 mg into feeding tube 3 (three) times daily.   phenol 1.4 % Liqd Commonly known as:  CHLORASEPTIC Use as directed 1 spray in the mouth or throat as needed for throat irritation / pain. Patient can take with her   rivaroxaban 20 MG Tabs tablet Commonly known as:  XARELTO Take 1 tablet (20 mg total) by mouth daily with supper.      Contact information for after-discharge care    [facility] .   Service:  Skilled Nursing Contact information: [address] [phone]             Allergies  Allergen Reactions  . Zosyn  [Piperacillin Sod-Tazobactam So] Anaphylaxis     Procedures/Studies:  EEG Attempted cardioversion in ED  Ct Head Wo Contrast  Result Date: 02/25/2018 CLINICAL DATA:  Mental status changes EXAM: CT HEAD WITHOUT CONTRAST TECHNIQUE: Contiguous axial images were obtained from the base of the skull through the vertex without intravenous contrast. COMPARISON:  01/06/2018 FINDINGS: Brain: Global atrophy. There are chronic ischemic changes in the periventricular white matter. There is no mass effect, midline shift, or acute hemorrhage. Vascular: No hyperdense vessel or unexpected calcification. Skull: Cranium is intact. Sinuses/Orbits: Mastoid air cells are clear. Visualized paranasal sinuses are clear. Orbits are unremarkable. Other: Noncontributory IMPRESSION: Chronic changes.  No acute intracranial pathology. Electronically Signed   By: Marybelle Killings M.D.   On: 02/25/2018 18:32   Dg Chest Port 1 View  Result Date: 02/24/2018 CLINICAL DATA:  Acute respiratory failure with hypoxia EXAM: PORTABLE CHEST 1 VIEW COMPARISON:  Yesterday FINDINGS: Normal heart size and mediastinal contours. Chronic interstitial coarsening. Mild elevation of the left diaphragm. There is no edema, consolidation, effusion, or pneumothorax. IMPRESSION: No evidence of active disease. Electronically Signed   By: Monte Fantasia M.D.   On: 02/24/2018 08:22     The results of significant diagnostics from this hospitalization (including imaging, microbiology, ancillary and laboratory) are listed below for reference.     Microbiology: Recent Results (from the past 240 hour(s))  MRSA PCR Screening     Status: None   Collection Time: 02/23/18  8:58 PM  Result Value Ref Range Status   MRSA by PCR NEGATIVE NEGATIVE Final    Comment:        The GeneXpert MRSA Assay (FDA approved for NASAL specimens only), is one component of a comprehensive MRSA colonization surveillance program. It is not intended to diagnose MRSA infection nor  to guide or monitor treatment for MRSA infections. Performed at [facility], [place] [address] [place], [place] [address]   Culture, blood (routine x 2)     Status: None   Collection Time: 02/23/18 10:40 PM  Result Value Ref Range Status   Specimen Description BLOOD LEFT HAND  Final   Special Requests   Final    BOTTLES DRAWN AEROBIC AND ANAEROBIC Blood Culture adequate volume   Culture  Final    NO GROWTH 5 DAYS Performed at [facility], [place] [address] [place], [place] [address]    Report Status 02/28/2018 FINAL  Final  Culture, blood (routine x 2)     Status: None   Collection Time: 02/23/18 10:45 PM  Result Value Ref Range Status   Specimen Description BLOOD LEFT HAND  Final   Special Requests   Final    BOTTLES DRAWN AEROBIC AND ANAEROBIC Blood Culture results may not be optimal due to an inadequate volume of blood received in culture bottles   Culture   Final    NO GROWTH 5 DAYS Performed at [facility], [place] [address] [place], [place] [address]    Report Status 02/28/2018 FINAL  Final     Labs: BNP (last 3 results) No results for input(s): BNP in the last 8760 hours. Basic Metabolic Panel: Recent Labs  Lab 02/24/18 0215 02/25/18 0610 02/27/18 0940 02/27/18 1441 02/28/18 0459 02/28/18 1337 03/01/18 0529  NA 139 141  --  141 141 139 135  K 3.4* 3.6  --  3.1* 2.9* 3.3* 3.9  CL 111 112*  --  113* 111 109 103  CO2 19* 20*  --  21* 23 24 23   GLUCOSE 183* 151*  --  188* 107* 120* 130*  BUN 36* 36*  --  22* 23* 18 12  CREATININE 0.64 0.52  --  0.48 0.46 0.50 0.43*  CALCIUM 7.4* 7.8*  --  7.6* 7.5* 7.5* 8.1*  MG 2.2 2.2  --  1.8 1.8  --  1.8  PHOS  --  1.3* 1.9*  --  1.8* 3.3 4.0   Liver Function Tests: No results for input(s): AST, ALT, ALKPHOS, BILITOT, PROT, ALBUMIN in the last 168 hours. No results for input(s): LIPASE, AMYLASE in the last 168 hours. No results for input(s): AMMONIA in the last 168 hours. CBC: Recent Labs  Lab  02/23/18 2149 02/24/18 0915 02/25/18 0610 02/26/18 0424 03/01/18 0723  WBC 16.4* 16.1* 7.8 7.7 10.9*  NEUTROABS 15.6*  --   --   --   --   HGB 12.9 11.6* 9.9* 10.0* 11.9*  HCT 38.8 35.4* 28.8* 29.6* 36.1  MCV 94.6 95.7 94.7 94.6 93.0  PLT 188 230 153 170 260   Cardiac Enzymes: Recent Labs  Lab 02/23/18 2149  TROPONINI 0.04*   BNP: Invalid input(s): POCBNP CBG: Recent Labs  Lab 02/28/18 1212 02/28/18 2008 03/01/18 0112 03/01/18 0410 03/01/18 0812  GLUCAP 142* 129* 125* 109* 139*   D-Dimer No results for input(s): DDIMER in the last 72 hours. Hgb A1c No results for input(s): HGBA1C in the last 72 hours. Lipid Profile No results for input(s): CHOL, HDL, LDLCALC, TRIG, CHOLHDL, LDLDIRECT in the last 72 hours. Thyroid function studies No results for input(s): TSH, T4TOTAL, T3FREE, THYROIDAB in the last 72 hours.  Invalid input(s): FREET3 Anemia work up No results for input(s): VITAMINB12, FOLATE, FERRITIN, TIBC, IRON, RETICCTPCT in the last 72 hours. Urinalysis    Component Value Date/Time   COLORURINE YELLOW 02/23/2018 2150   APPEARANCEUR HAZY (A) 02/23/2018 2150   LABSPEC 1.030 02/23/2018 2150   PHURINE 5.0 02/23/2018 2150   GLUCOSEU 50 (A) 02/23/2018 2150   HGBUR SMALL (A) 02/23/2018 2150   BILIRUBINUR NEGATIVE 02/23/2018 2150   KETONESUR NEGATIVE 02/23/2018 2150   PROTEINUR 30 (A) 02/23/2018 2150   NITRITE NEGATIVE 02/23/2018 2150   LEUKOCYTESUR NEGATIVE 02/23/2018 2150   Sepsis Labs Invalid input(s): PROCALCITONIN,  WBC,  [place] Recent Results (from the past 240 hour(s))  MRSA PCR Screening     Status: None   Collection Time: 02/23/18  8:58 PM  Result Value Ref Range Status   MRSA by PCR NEGATIVE NEGATIVE Final    Comment:        The GeneXpert MRSA Assay (FDA approved for NASAL specimens only), is one component of a comprehensive MRSA colonization surveillance program. It is not intended to diagnose MRSA infection nor to guide  or monitor treatment for MRSA infections. Performed at [facility], [place] [address], [place], [place] [address]   Culture, blood (routine x 2)     Status: None   Collection Time: 02/23/18 10:40 PM  Result Value Ref Range Status   Specimen Description BLOOD LEFT HAND  Final   Special Requests   Final    BOTTLES DRAWN AEROBIC AND ANAEROBIC Blood Culture adequate volume   Culture   Final    NO GROWTH 5 DAYS Performed at [facility], [place] [address] [place], [place] [address]    Report Status 02/28/2018 FINAL  Final  Culture, blood (routine x 2)     Status: None   Collection Time: 02/23/18 10:45 PM  Result Value Ref Range Status   Specimen Description BLOOD LEFT HAND  Final   Special Requests   Final    BOTTLES DRAWN AEROBIC AND ANAEROBIC Blood Culture results may not be optimal due to an inadequate volume of blood received in culture bottles   Culture   Final    NO GROWTH 5 DAYS Performed at [facility], [place] [address] [place],  [address]    Report Status 02/28/2018 FINAL  Final     Time coordinating discharge: 43  SIGNED:   Debbe Odea, MD  [facility] 03/01/2018, 11:04 AM Pager   If 7PM-7AM, please contact night-coverage [email or web address] Password [email or web address]

## 2018-03-01 NOTE — Care Management Important Message (Signed)
Important Message  Patient Details  Name: Paige Murphy MRN: [id number] Date of Birth: [birth year]   Medicare Important Message Given:  Yes    Barb Merino Aylinn Rydberg 03/01/2018, 3:23 PM

## 2018-03-01 NOTE — Clinical Social Work Note (Addendum)
Discharge summary sent to [facility]. Discharge order pending. CSW left message for admissions coordinator. Waiting on call back.  [place] Scrape, [place] [phone]  1:04 pm Received call from [facility] admissions coordinator. Patient was on Jevity 1.2 prior to admission. Discharge summary medications list Osmolite 1.2. SNF will not be able to obtain Osmolite until next week since it is Friday afternoon. MD notified and stated this is what the dietician recommended and she wanted to continue with that recommendation. CSW notified her that SNF will not be able to get Osmolite until next week so she will change it back to [place].  [place] Scrape, [place]

## 2018-03-01 NOTE — Progress Notes (Signed)
Imodium administered. Unable to record accurate output due to three  incontinent episodes and external catheter not able to function properly due to diarrhea.Patient denies abdominal pain 0/10.   Will continue to monitor.  Barbaraann Avans, RN

## 2018-03-01 NOTE — Progress Notes (Signed)
Report called to Pamala Hurry, LPN at [facility].  Patient and her sister informed transportation has been called and report called.

## 2018-03-01 NOTE — Progress Notes (Signed)
Patient with 3rd episode of diarrhea since 7 pm. No PRN to give. Paged MD on call for [facility]. Verbal order with read back per MD R.Smith given for Imodium 2 mg BID PRN for loose stools.  Waiting on medication from pharmacy.  Will continue to monitor.  [place] Warriner, RN

## 2018-03-01 NOTE — Clinical Social Work Note (Signed)
CSW facilitated patient discharge including contacting patient family (sister at bedside) and facility to confirm patient discharge plans. Clinical information faxed to facility and family agreeable with plan. CSW arranged ambulance transport via [facility] to [facility]. RN to call report prior to discharge [phone]).  CSW will sign off for now as social work intervention is no longer needed. Please consult [place] again if new needs arise.  Paige Murphy, [place]

## 2018-03-13 ENCOUNTER — Encounter: Payer: Self-pay | Admitting: Neurology

## 2018-03-13 ENCOUNTER — Ambulatory Visit (INDEPENDENT_AMBULATORY_CARE_PROVIDER_SITE_OTHER): Payer: Medicare Other | Admitting: Neurology

## 2018-03-13 VITALS — BP 96/56 | HR 74

## 2018-03-13 DIAGNOSIS — G231 Progressive supranuclear ophthalmoplegia [Steele-Richardson-Olszewski]: Secondary | ICD-10-CM | POA: Diagnosis not present

## 2018-03-13 NOTE — Progress Notes (Signed)
Reason for visit: Progressive supranuclear palsy  Paige Murphy is an 72 y.o. female  History of present illness:  Paige Murphy is a 72 year old right-handed white female with a history of progressive supranuclear palsy.  The patient has had a recent admission on 23 February 2018 with respiratory failure.  The patient had a very high heart rate associated with atrial fibrillation, she had to be cardioverted.  The patient has not returned to her baseline since that time.  The patient is getting her nutrition through a feeding tube, but she is still losing some weight.  They are still trying to give her trays, but she functionally cannot eat, she can get in some soft foods such as ice cream or pudding.  The patient can no longer transfer to the toilet.  She has had gradually worsening problems with speech, is very hard to understand what she is saying at this point.  She returns for an evaluation.  Past Medical History:  Diagnosis Date  . Abnormality of gait 04/13/2014  . Anxiety   . Breast cancer [facility])    s/p radiation  . Chronic fatigue 05/15/2016  . Depression   . Depression   . DJD (degenerative joint disease)   . Dyslipidemia   . Hypothyroid   . Meniere's disease   . Obese   . Osteopenia   . Osteopenia   . Parkinsonism ([place]) 12/24/2014  . 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 PSP (progressive supranuclear palsy) ([place]) 10/27/2015  . Sleep apnea   . TIA (transient ischemic attack)   . Wrist pain 2014    Past Surgical History:  Procedure Laterality Date  . APPENDECTOMY    . LAPAROSCOPIC VAGINAL HYSTERECTOMY  1984  . MASTECTOMY  2010   breast cancer, left  . NECK SURGERY  1999   cervical procedure  . OOPHORECTOMY  1984    Family History  Problem Relation Age of Onset  . Dementia Mother   . COPD Mother   . Parkinsonism Mother   . Stroke Mother   . Heart disease Father   . Heart disease Brother   . Heart disease Sister     Social history:  reports that she has quit  smoking. She started smoking about 39 years ago. She has never used smokeless tobacco. She reports that she does not drink alcohol or use drugs.    Allergies  Allergen Reactions  . Zosyn [Piperacillin Sod-Tazobactam So] Anaphylaxis    Medications:  Prior to Admission medications   Medication Sig Start Date End Date Taking? Authorizing Provider  acetaminophen (TYLENOL) 500 MG tablet Place 1,000 mg into feeding tube every 6 (six) hours as needed for fever (pain/ temp >101).   Yes [provider]  amiodarone (PACERONE) 400 MG tablet Place 1 tablet (400 mg total) into feeding tube 2 (two) times daily. 03/01/18  Yes Debbe Odea, MD  aspirin 81 MG tablet Place 81 mg into feeding tube daily.   Yes [provider]  docusate (COLACE) 50 MG/5ML liquid Place 100 mg into feeding tube 2 (two) times daily.   Yes [provider]  escitalopram (LEXAPRO) 10 MG tablet Place 10 mg into feeding tube at bedtime.    Yes [provider]  levothyroxine (SYNTHROID, LEVOTHROID) 112 MCG tablet Place 112 mcg into feeding tube daily before breakfast.   Yes [provider]  loperamide (IMODIUM A-D) 2 MG tablet Take 1 tablet (2 mg total) by mouth 4 (four) times daily as needed for diarrhea or loose  stools. Patient taking differently: Place 2 mg into feeding tube every 6 (six) hours as needed for diarrhea or loose stools.  03/01/18  Yes Debbe Odea, MD  Nutritional Supplements (NUTRITIONAL SUPPLEMENT PO) Place 180 mLs into feeding tube 4 (four) times daily. House 2.0 /med pass   Yes [provider]  Nutritional Supplements (PROMOD) LIQD Place 30 mLs into feeding tube 2 (two) times daily.    Yes [provider]  NUTRITIONAL SUPPLEMENTS PO Take 1 each by mouth 2 (two) times daily before lunch and supper. Commercial supplement "mighty shake"   Yes [provider]  oxybutynin (DITROPAN) 5 MG tablet Place 5 mg into feeding tube 3 (three) times daily.    Yes  [provider]  phenol (CHLORASEPTIC) 1.4 % LIQD Use as directed 1 spray in the mouth or throat as needed for throat irritation / pain. Patient can take with her 03/01/18  Yes Debbe Odea, MD  rivaroxaban (XARELTO) 20 MG TABS tablet Take 1 tablet (20 mg total) by mouth daily with supper. 03/01/18  Yes Debbe Odea, MD  Water For Irrigation, Sterile (FREE WATER) SOLN Place 200 mLs into feeding tube every 8 (eight) hours. 03/01/18  Yes Debbe Odea, MD    ROS:  Out of a complete 14 system review of symptoms, the patient complains only of the following symptoms, and all other reviewed systems are negative.  Decreased activity, weight loss Difficulty swallowing Eye redness Choking Snoring Incontinence of the bladder Back pain, walking difficulty, neck pain, neck stiffness Speech problems, weakness Depression  Blood pressure (!) 96/56, pulse 74, SpO2 97 %.  Physical Exam  General: The patient is alert and cooperative at the time of the examination.  Skin: No significant peripheral edema is noted.   Neurologic Exam  Mental status: The patient is alert and oriented x 3 at the time of the examination. The patient has apparent normal recent and remote memory, with an apparently normal attention span and concentration ability.   Cranial nerves: Facial symmetry is present, the patient has masking of the face.  Speech is hypophonic, difficult to understand.  Extraocular movements are restricted in the vertical plane, the patient does have horizontal eye movements.  The patient has blink to threat bilaterally..  Motor: The patient has good strength in all 4 extremities.  Sensory examination: Soft touch sensation is symmetric on the face, arms, and legs.  Coordination: The patient has good finger-nose-finger and heel-to-shin bilaterally.  Gait and station: The patient is nonambulatory, she is wheelchair-bound.  Reflexes: Deep tendon reflexes are symmetric in the legs, the left  arm is hyperreflexic.   Assessment/Plan:  1.  PSP  The patient is relying upon her feeding tube for nutrition.  The dietitian needs to have full nutrition through the tube, she may have snacks such as pudding or ice cream on a regular basis if she feels like eating.  There is no specific treatment other than supportive care for this patient.  She will follow-up in 6 months.  Jill Alexanders MD 03/13/2018 4:10 PM  [facility] [address] [place], [place] [address]  Phone [phone] Fax [phone]

## 2018-03-20 ENCOUNTER — Telehealth: Payer: Self-pay | Admitting: Neurology

## 2018-03-20 NOTE — Telephone Encounter (Signed)
I tried to call [facility], unable to leave a message.  When I saw the patient in the office last, we discussed giving the patient a full nutritional supplementation through the feeding tube, allow her small amounts of oral soft food if she desires, mainly for taste.  The family claims that she was getting trays of food that she could not eat.  The family was under the impression that she was still losing weight, not getting enough nutrition through her feeding tube.  I will try to call back tomorrow.

## 2018-03-20 NOTE — Telephone Encounter (Signed)
Paige Murphy/[phone] called to advise she said they are having a care pain with family and the medical director there Friday at 1pm. Pt is asking in addition to ice cream and pudding, yougart, oatmeal and cream of chicken soup. She thinks the family is not on the same page, they are not accepting the pt's true diagnosis and condition. She thinks the pt may feel guilty that she is unable to eat by mouth and will try to eat if they bring her something by mouth. She is asking for last office notes so they have a correct plan of care. Paige also said the pt should not have anything by mouth as she is getting choked on the oatmeal.  This operator faxed last OV to [phone] FYI Please call to discuss.

## 2018-03-20 NOTE — Telephone Encounter (Signed)
Dr. Jannifer Franklin- please advise  Faxed last office note from Dr. Jannifer Franklin to [place], [place] at [phone]. Received fax confirmation.

## 2018-03-21 NOTE — Telephone Encounter (Signed)
I called back again, left a message for [facility].  I reiterated the issue about all nutritional needs going to the tube, the patient may have pudding consistency food if she wants it just for taste.

## 2018-07-16 ENCOUNTER — Telehealth: Payer: Self-pay | Admitting: Neurology

## 2018-07-16 NOTE — Telephone Encounter (Signed)
Pleas Patricia, patient's sister advised patient passed away 07-23-2018. She wants to thank Dr. Jannifer Franklin for all the wonderful care he gave to her.

## 2018-07-16 NOTE — Telephone Encounter (Signed)
Events noted

## 2018-07-28 DEATH — deceased

## 2018-09-26 ENCOUNTER — Ambulatory Visit: Payer: Medicare Other | Admitting: Neurology

## 2019-12-13 IMAGING — DX DG CHEST 1V PORT
1 series · 1 of 1 positions shown · non-contrast
Comparison: Yesterday

CLINICAL DATA: Acute respiratory failure with hypoxia

EXAM:
PORTABLE CHEST 1 VIEW

[chest ap]
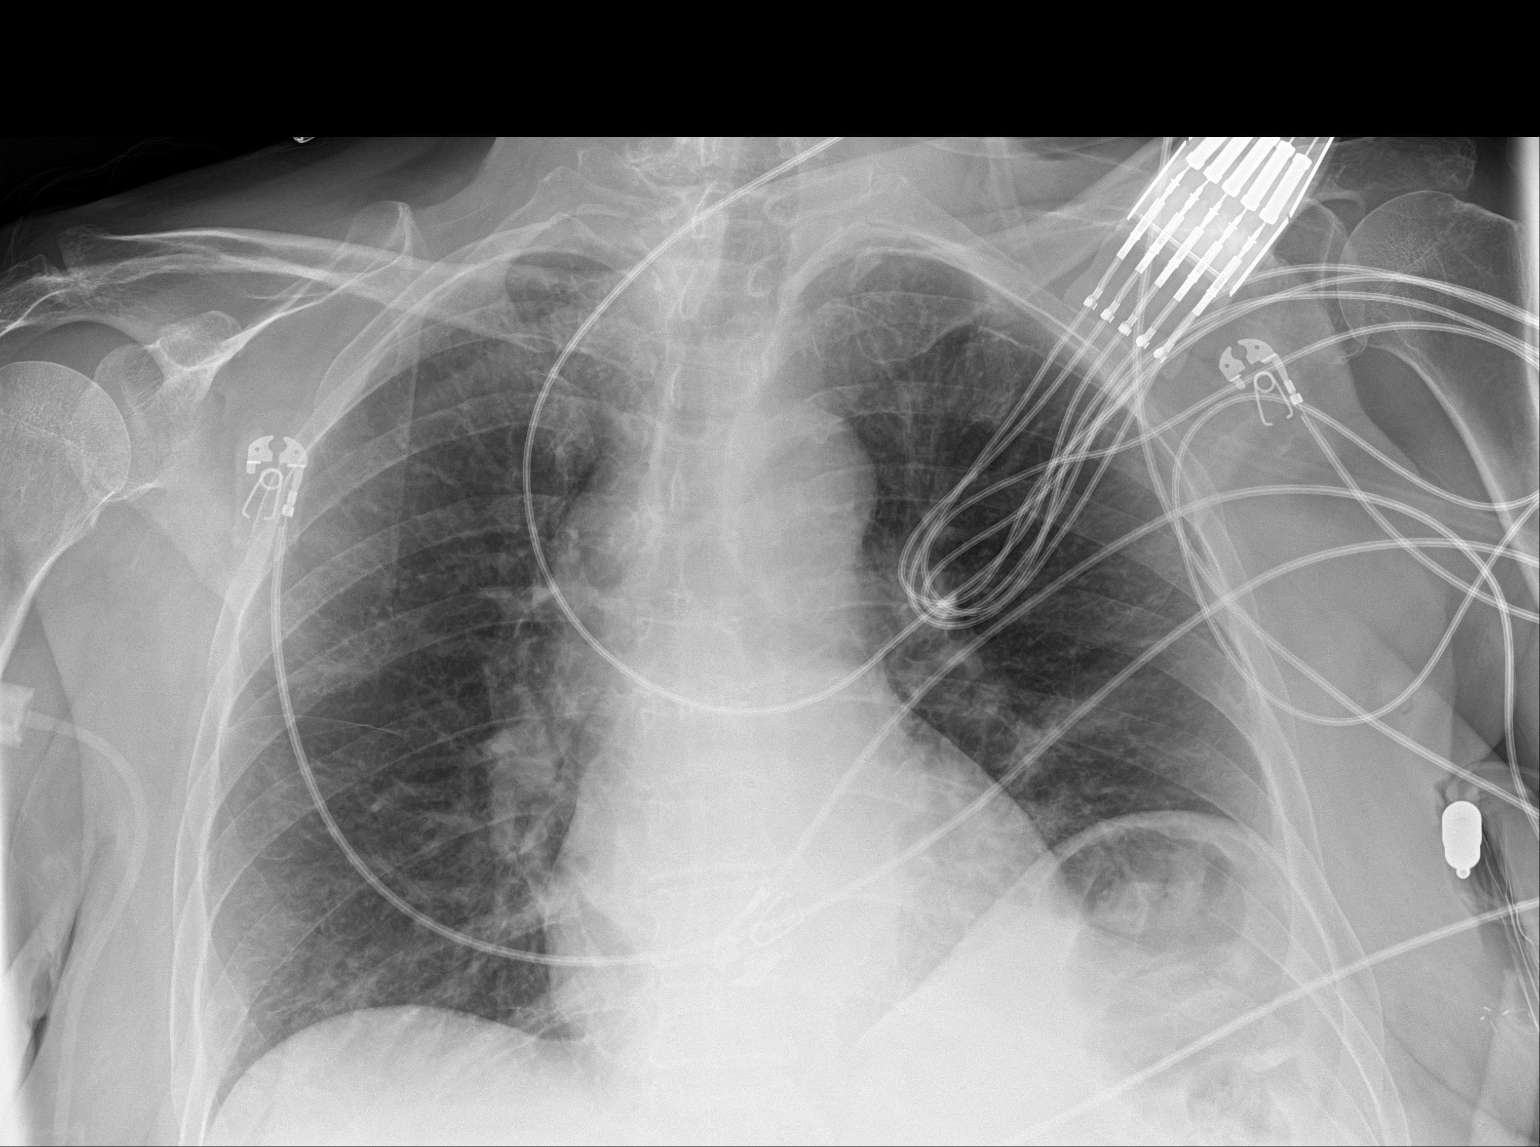

[1 of 1 positions shown; findings below may reference images not displayed]

FINDINGS: Normal heart size and mediastinal contours. Chronic interstitial
coarsening. Mild elevation of the left diaphragm. There is no edema,
consolidation, effusion, or pneumothorax.
IMPRESSION: No evidence of active disease.

## 2019-12-14 IMAGING — CT CT HEAD W/O CM
4 series · 17 of 47 positions shown, 19 images · non-contrast
Comparison: 01/06/2018

CLINICAL DATA: Mental status changes

EXAM:
CT HEAD WITHOUT CONTRAST
TECHNIQUE: Contiguous axial images were obtained from the base of the skull
through the vertex without intravenous contrast.

[Series 3: head without · axial · non-contrast · 0.44mm/px · z∈[-382,-262]mm · 7 of 34 slices shown, 9 images]
[im 5/34  brain]
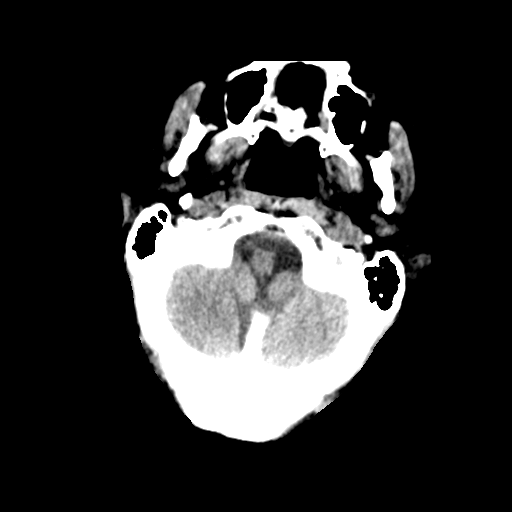
[im 5/34  bone]
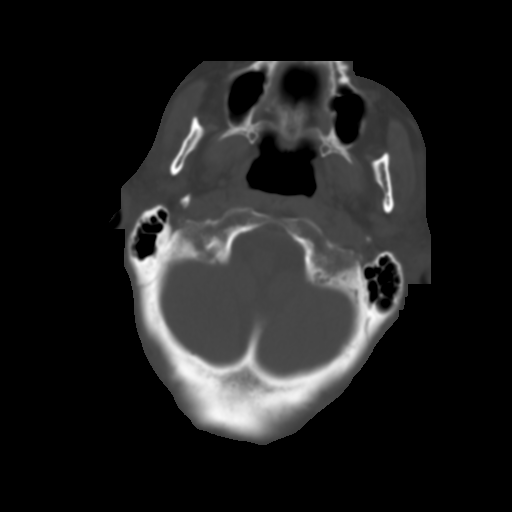
[im 9/34  brain]
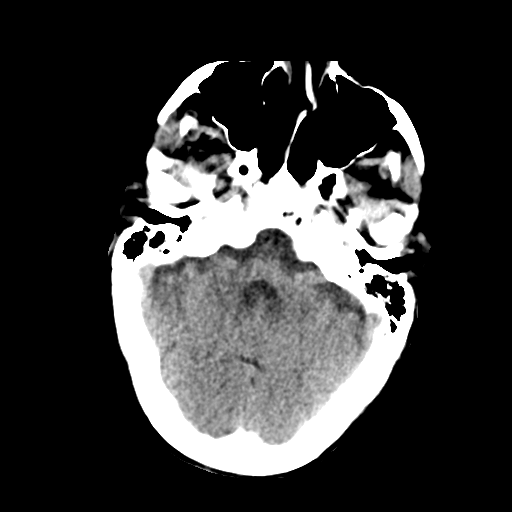
[im 13/34  brain]
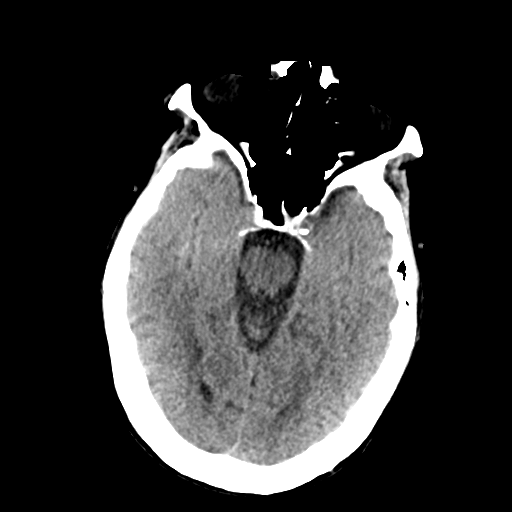
[im 17/34  brain]
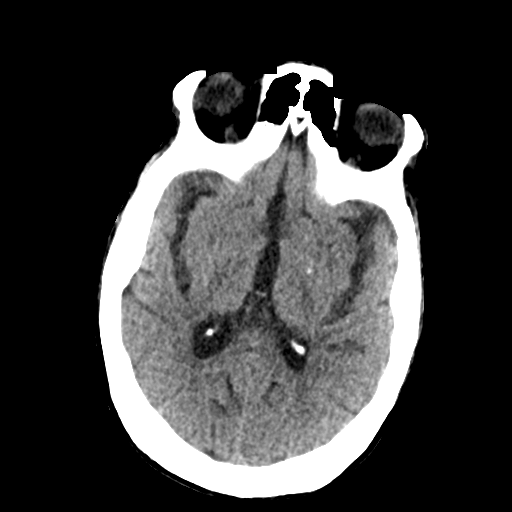
[im 21/34  brain]
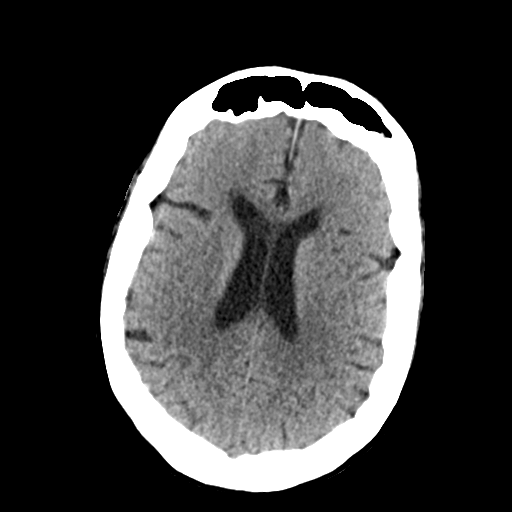
[im 21/34  bone]
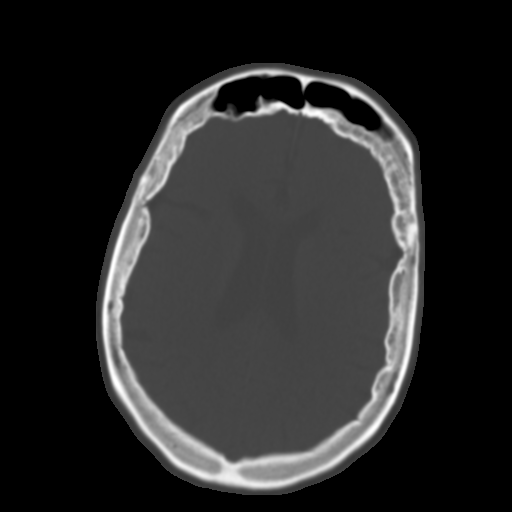
[im 25/34  brain]
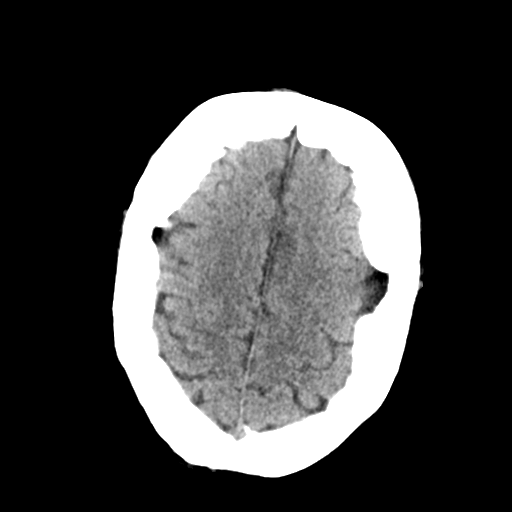
[im 29/34  brain]
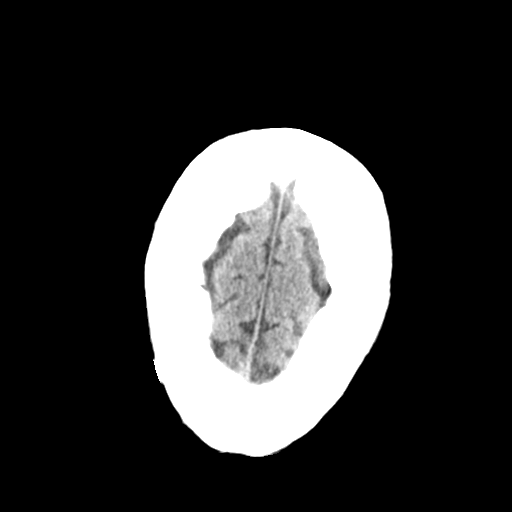

[Series 4: head bone · axial · 0.44mm/px · z∈[-386,-328]mm · 4 of 84 slices shown]
[im 9/84  bone]
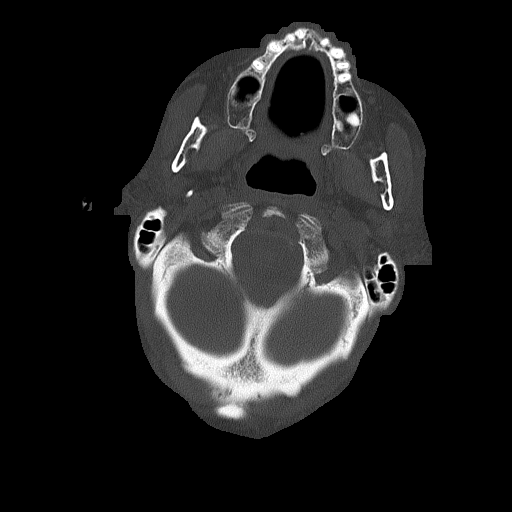
[im 17/84  bone]
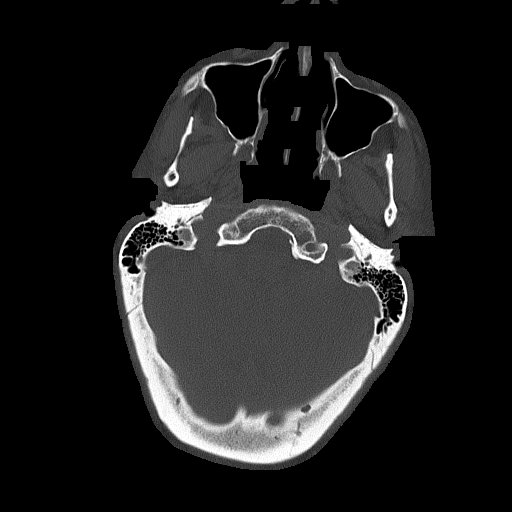
[im 25/84  bone]
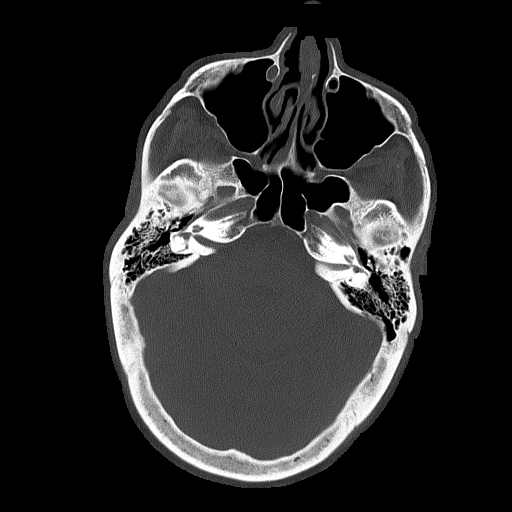
[im 38/84  bone]
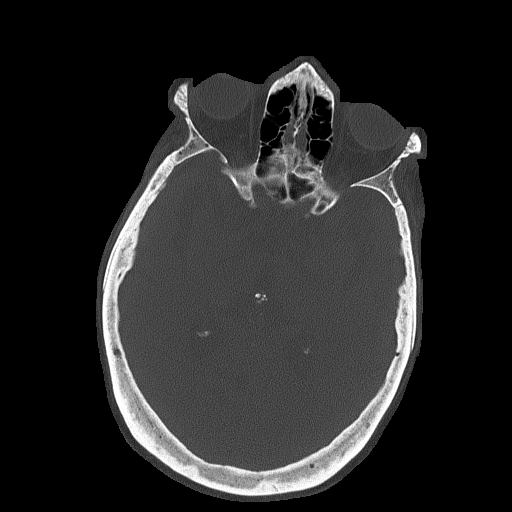

[Series 5: head without cor · coronal · non-contrast · 0.33mm/px · 3 of 67 slices shown]
[im 23/67  brain]
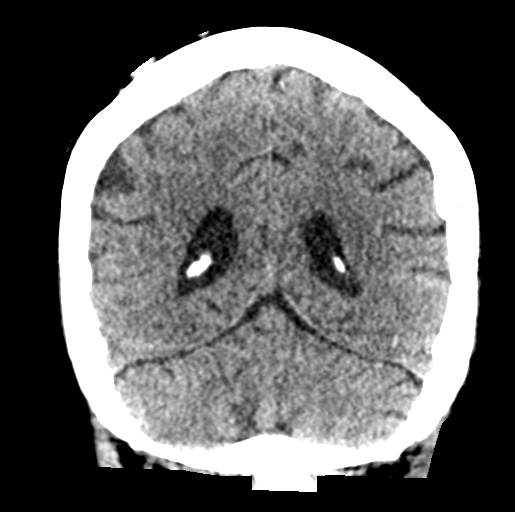
[im 30/67  brain]
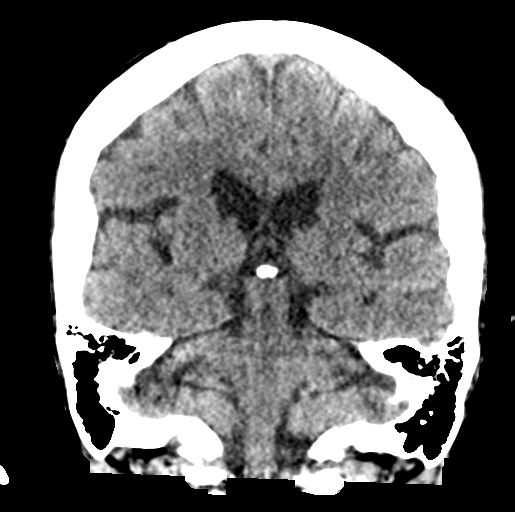
[im 37/67  brain]
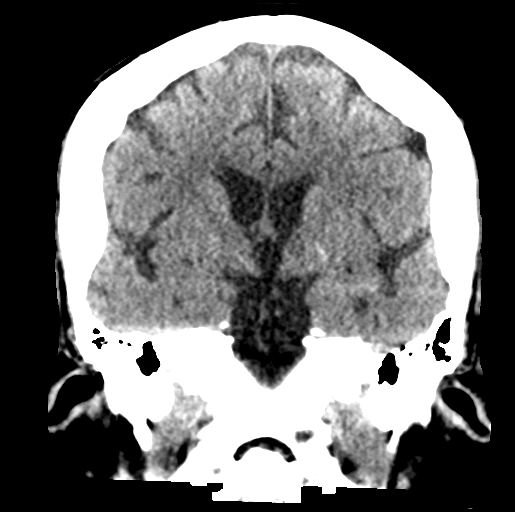

[Series 6: head without sag · sagittal · non-contrast · 0.33mm/px · 3 of 65 slices shown]
[im 22/65  brain]
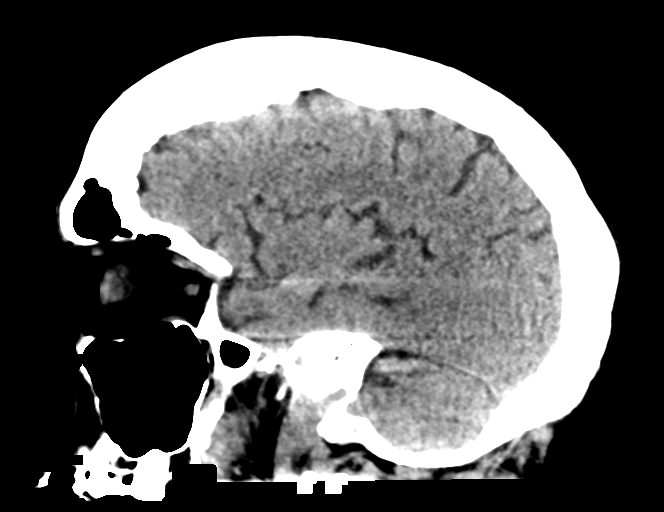
[im 33/65  brain]
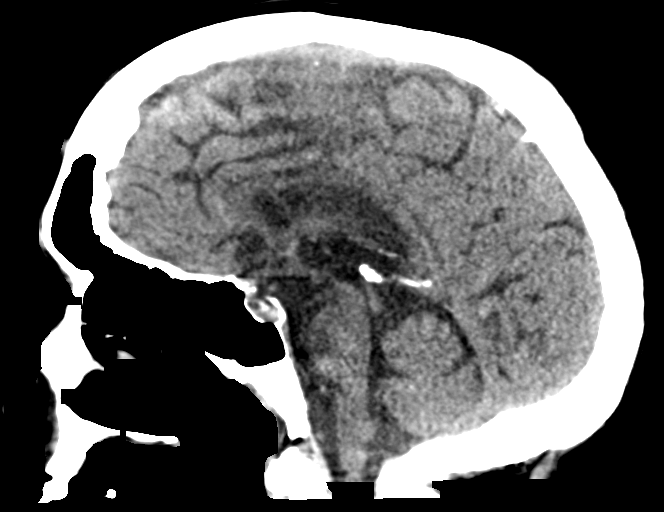
[im 43/65  brain]
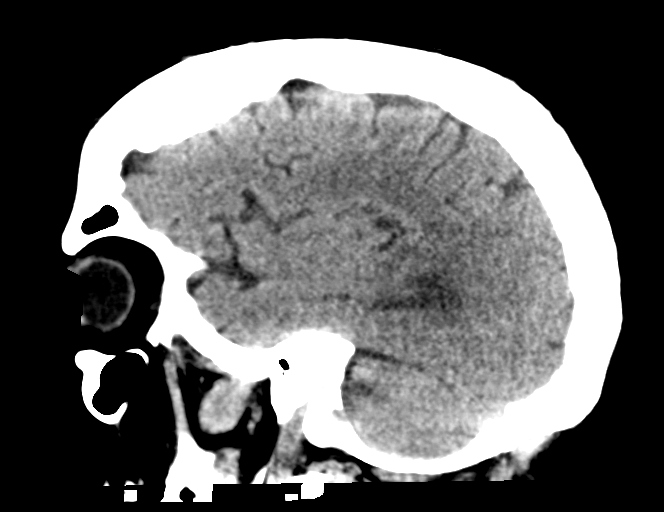

[17 of 47 positions shown; findings below may reference images not displayed]

FINDINGS: Brain: Global atrophy. There are chronic ischemic changes in the
periventricular white matter. There is no mass effect, midline
shift, or acute hemorrhage.

Vascular: No hyperdense vessel or unexpected calcification.

Skull: Cranium is intact.

Sinuses/Orbits: Mastoid air cells are clear. Visualized paranasal
sinuses are clear. Orbits are unremarkable.

Other: Noncontributory
IMPRESSION: Chronic changes.  No acute intracranial pathology.
